# Patient Record
Sex: Female | Born: 1975 | Race: Black or African American | Hispanic: No | Smoking: Never smoker
Health system: Southern US, Community
[De-identification: ages and names within clinical notes are randomized; demographics above are authoritative.]

## PROBLEM LIST (undated history)

## (undated) DIAGNOSIS — Z22322 Carrier or suspected carrier of Methicillin resistant Staphylococcus aureus: Secondary | ICD-10-CM

## (undated) HISTORY — PX: BREAST BIOPSY: SHX20

---

## 2005-12-06 ENCOUNTER — Emergency Department (HOSPITAL_COMMUNITY): Admission: EM | Admit: 2005-12-06 | Discharge: 2005-12-07 | Payer: Self-pay | Admitting: Emergency Medicine

## 2006-07-14 ENCOUNTER — Emergency Department (HOSPITAL_COMMUNITY): Admission: EM | Admit: 2006-07-14 | Discharge: 2006-07-14 | Payer: Self-pay | Admitting: Family Medicine

## 2007-03-08 ENCOUNTER — Emergency Department (HOSPITAL_COMMUNITY): Admission: EM | Admit: 2007-03-08 | Discharge: 2007-03-08 | Payer: Self-pay | Admitting: Emergency Medicine

## 2007-03-11 ENCOUNTER — Emergency Department (HOSPITAL_COMMUNITY): Admission: EM | Admit: 2007-03-11 | Discharge: 2007-03-11 | Payer: Self-pay | Admitting: Family Medicine

## 2009-12-28 ENCOUNTER — Emergency Department (HOSPITAL_COMMUNITY): Admission: EM | Admit: 2009-12-28 | Discharge: 2009-12-28 | Payer: Self-pay | Admitting: Emergency Medicine

## 2009-12-30 ENCOUNTER — Emergency Department (HOSPITAL_COMMUNITY): Admission: EM | Admit: 2009-12-30 | Discharge: 2009-12-30 | Payer: Self-pay | Admitting: Emergency Medicine

## 2010-06-16 ENCOUNTER — Emergency Department (HOSPITAL_COMMUNITY): Admission: EM | Admit: 2010-06-16 | Discharge: 2010-06-16 | Payer: Self-pay | Admitting: Emergency Medicine

## 2010-06-16 ENCOUNTER — Ambulatory Visit: Payer: Self-pay | Admitting: Vascular Surgery

## 2011-02-24 LAB — URINALYSIS, ROUTINE W REFLEX MICROSCOPIC
Bilirubin Urine: NEGATIVE
Glucose, UA: NEGATIVE mg/dL
Hgb urine dipstick: NEGATIVE
Ketones, ur: NEGATIVE mg/dL
Nitrite: NEGATIVE
Protein, ur: NEGATIVE mg/dL
Specific Gravity, Urine: 1.008 (ref 1.005–1.030)
Urobilinogen, UA: 0.2 mg/dL (ref 0.0–1.0)
pH: 6.5 (ref 5.0–8.0)

## 2011-02-24 LAB — CBC
HCT: 31.7 % — ABNORMAL LOW (ref 36.0–46.0)
Hemoglobin: 10.7 g/dL — ABNORMAL LOW (ref 12.0–15.0)
MCH: 22.7 pg — ABNORMAL LOW (ref 26.0–34.0)
MCHC: 33.7 g/dL (ref 30.0–36.0)
MCV: 67.3 fL — ABNORMAL LOW (ref 78.0–100.0)
Platelets: 442 10*3/uL — ABNORMAL HIGH (ref 150–400)
RBC: 4.72 MIL/uL (ref 3.87–5.11)
RDW: 18.6 % — ABNORMAL HIGH (ref 11.5–15.5)
WBC: 8.1 10*3/uL (ref 4.0–10.5)

## 2011-02-24 LAB — COMPREHENSIVE METABOLIC PANEL
ALT: 13 U/L (ref 0–35)
AST: 25 U/L (ref 0–37)
Albumin: 4.1 g/dL (ref 3.5–5.2)
Alkaline Phosphatase: 79 U/L (ref 39–117)
BUN: 7 mg/dL (ref 6–23)
CO2: 26 mEq/L (ref 19–32)
Calcium: 8.9 mg/dL (ref 8.4–10.5)
Chloride: 100 mEq/L (ref 96–112)
Creatinine, Ser: 0.87 mg/dL (ref 0.4–1.2)
GFR calc Af Amer: >60 mL/min (ref >60)
GFR calc non Af Amer: >60 mL/min (ref >60)
Glucose, Bld: 86 mg/dL (ref 70–99)
Potassium: 3.8 mEq/L (ref 3.5–5.1)
Sodium: 134 mEq/L — ABNORMAL LOW (ref 135–145)
Total Bilirubin: 0.7 mg/dL (ref 0.3–1.2)
Total Protein: 8.6 g/dL — ABNORMAL HIGH (ref 6.0–8.3)

## 2011-02-24 LAB — DIFFERENTIAL
Basophils Absolute: 0.2 10*3/uL — ABNORMAL HIGH (ref 0.0–0.1)
Basophils Relative: 3 % — ABNORMAL HIGH (ref 0–1)
Eosinophils Absolute: 0.1 10*3/uL (ref 0.0–0.7)
Eosinophils Relative: 1 % (ref 0–5)
Lymphocytes Relative: 41 % (ref 12–46)
Lymphs Abs: 3.3 10*3/uL (ref 0.7–4.0)
Monocytes Absolute: 0.4 10*3/uL (ref 0.1–1.0)
Monocytes Relative: 5 % (ref 3–12)
Neutro Abs: 4.1 10*3/uL (ref 1.7–7.7)
Neutrophils Relative %: 50 % (ref 43–77)

## 2011-02-24 LAB — D-DIMER, QUANTITATIVE: D-Dimer, Quant: 0.22 ug/mL-FEU (ref 0.00–0.48)

## 2011-02-24 LAB — URINE MICROSCOPIC-ADD ON

## 2011-02-24 LAB — BRAIN NATRIURETIC PEPTIDE: Pro B Natriuretic peptide (BNP): <30 pg/mL (ref 0.0–100.0)

## 2011-02-24 LAB — POCT PREGNANCY, URINE: Preg Test, Ur: NEGATIVE

## 2011-04-03 ENCOUNTER — Emergency Department (HOSPITAL_COMMUNITY): Payer: Self-pay

## 2011-04-03 ENCOUNTER — Emergency Department (HOSPITAL_COMMUNITY)
Admission: EM | Admit: 2011-04-03 | Discharge: 2011-04-03 | Disposition: A | Discharge status: Home / Self Care | Payer: Self-pay | Attending: Emergency Medicine | Admitting: Emergency Medicine

## 2011-04-03 DIAGNOSIS — R059 Cough, unspecified: Secondary | ICD-10-CM | POA: Insufficient documentation

## 2011-04-03 DIAGNOSIS — R0602 Shortness of breath: Secondary | ICD-10-CM | POA: Insufficient documentation

## 2011-04-03 DIAGNOSIS — R05 Cough: Secondary | ICD-10-CM | POA: Insufficient documentation

## 2011-04-03 DIAGNOSIS — J40 Bronchitis, not specified as acute or chronic: Secondary | ICD-10-CM | POA: Insufficient documentation

## 2011-11-05 ENCOUNTER — Encounter: Payer: Self-pay | Admitting: Emergency Medicine

## 2011-11-05 ENCOUNTER — Emergency Department (HOSPITAL_COMMUNITY)
Admission: EM | Admit: 2011-11-05 | Discharge: 2011-11-05 | Disposition: A | Discharge status: Home / Self Care | Payer: Self-pay | Attending: Emergency Medicine | Admitting: Emergency Medicine

## 2011-11-05 DIAGNOSIS — R21 Rash and other nonspecific skin eruption: Secondary | ICD-10-CM | POA: Insufficient documentation

## 2011-11-05 DIAGNOSIS — M7989 Other specified soft tissue disorders: Secondary | ICD-10-CM | POA: Insufficient documentation

## 2011-11-05 DIAGNOSIS — L0291 Cutaneous abscess, unspecified: Secondary | ICD-10-CM | POA: Insufficient documentation

## 2011-11-05 DIAGNOSIS — L0390 Cellulitis, unspecified: Secondary | ICD-10-CM

## 2011-11-05 LAB — POCT I-STAT, CHEM 8
BUN: 6 mg/dL (ref 6–23)
Calcium, Ion: 1.15 mmol/L (ref 1.12–1.32)
Chloride: 104 mEq/L (ref 96–112)
Creatinine, Ser: 0.8 mg/dL (ref 0.50–1.10)
Glucose, Bld: 87 mg/dL (ref 70–99)
HCT: 29 % — ABNORMAL LOW (ref 36.0–46.0)
Hemoglobin: 9.9 g/dL — ABNORMAL LOW (ref 12.0–15.0)
Potassium: 4 mEq/L (ref 3.5–5.1)
Sodium: 140 mEq/L (ref 135–145)
TCO2: 23 mmol/L (ref 0–100)

## 2011-11-05 MED ORDER — OXYCODONE-ACETAMINOPHEN 5-325 MG PO TABS: 1.0000 | ORAL_TABLET | Freq: Four times a day (QID) | ORAL | Status: AC | PRN

## 2011-11-05 MED ORDER — CEPHALEXIN 500 MG PO CAPS
500.0000 mg | ORAL_CAPSULE | Freq: Once | ORAL | Status: AC
  Administered 2011-11-05: 500 mg via ORAL
  Filled 2011-11-05: qty 1

## 2011-11-05 MED ORDER — POTASSIUM CHLORIDE CRYS ER 20 MEQ PO TBCR: 20.0000 meq | EXTENDED_RELEASE_TABLET | Freq: Two times a day (BID) | ORAL | Status: DC

## 2011-11-05 MED ORDER — FUROSEMIDE 20 MG PO TABS: 40.0000 mg | ORAL_TABLET | Freq: Every day | ORAL | Status: DC

## 2011-11-05 MED ORDER — OXYCODONE-ACETAMINOPHEN 5-325 MG PO TABS
1.0000 | ORAL_TABLET | Freq: Once | ORAL | Status: AC
  Administered 2011-11-05: 1 via ORAL
  Filled 2011-11-05: qty 1

## 2011-11-05 MED ORDER — CEPHALEXIN 500 MG PO CAPS: 500.0000 mg | ORAL_CAPSULE | Freq: Four times a day (QID) | ORAL | Status: DC

## 2011-11-05 NOTE — ED Notes (Signed)
Pt states has been having swelling in legs but worse in R leg. States feels "fluid moving in back" pt was recently at beach and is unsure if she got stung or not

## 2011-11-05 NOTE — ED Notes (Signed)
Pt states is suppose to be taking fluid and potassium pills.

## 2011-11-05 NOTE — ED Provider Notes (Signed)
History     CSN: [id number] Arrival date & time: 11/05/2011 11:57 AM   First MD Initiated Contact with Patient 11/05/11 1235      Chief Complaint  Patient presents with  . Leg Swelling    (Consider location/radiation/quality/duration/timing/severity/associated sxs/prior treatment) HPI Pt reports she has had swelling in her bilateral LE off and on for months. She has had extensive workup including labs and doppler which have been neg. She was taking Lasix 40mg  for same, but has not had any in about a month. She has felt increased swelling for the last few days, improved with rest and elevation. She has also noticed an area of swelling, pain and redness to her R lateral thigh. No known injury, possibly bit by something while out of town. No drainage or fever. No Vomiting or diarrhea.   History reviewed. No pertinent past medical history.  Past Surgical History  Procedure Date  . Cesarean section     History reviewed. No pertinent family history.  History  Substance Use Topics  . Smoking status: Not on file  . Smokeless tobacco: Not on file  . Alcohol Use: No    OB History    Grav Para Term Preterm Abortions TAB SAB Ect Mult Living                  Review of Systems Unable to assess due to mental status.   Allergies  Review of patient's allergies indicates no known allergies.  Home Medications   Current Outpatient Rx  Name Route Sig Dispense Refill  . ADAPALENE 0.1 % EX CREA Topical Apply 1 application topically daily.      . IBUPROFEN 800 MG PO TABS Oral Take 800 mg by mouth every 8 (eight) hours as needed. For menstrual pain       BP 124/88  Pulse 92  Temp(Src) 98.8 F (37.1 C) (Oral)  Resp 16  SpO2 100%  LMP 10/30/2011  Physical Exam  Nursing note and vitals reviewed. Constitutional: She is oriented to person, place, and time. She appears well-developed and well-nourished.  HENT:  Head: Normocephalic and atraumatic.  Eyes: EOM are normal. Pupils are  equal, round, and reactive to light.  Neck: Normal range of motion. Neck supple.  Cardiovascular: Normal rate, normal heart sounds and intact distal pulses.   Pulmonary/Chest: Effort normal and breath sounds normal.  Abdominal: Bowel sounds are normal. She exhibits no distension. There is no tenderness.  Musculoskeletal: Normal range of motion. She exhibits no edema and no tenderness.  Neurological: She is alert and oriented to person, place, and time. She has normal strength. No cranial nerve deficit or sensory deficit.  Skin: Skin is warm and dry. Rash (4cm x 4cm area of erythema and induration to R lateral thigh without focal fluctuance) noted.  Psychiatric: She has a normal mood and affect.    ED Course  Procedures (including critical care time)  Labs Reviewed  POCT I-STAT, CHEM 8 - Abnormal; Notable for the following:    Hemoglobin 9.9 (*)    HCT 29.0 (*)    All other components within normal limits  I-STAT, CHEM 8   No results found.   No diagnosis found.    MDM  No significant LE edema. Chem is normal. Will refill Lasix and Potassium, advised PCP followup. Abx and pain meds for cellulitis of R thigh.        Charles B. Bernette Mayers, MD 11/05/11 1336

## 2011-11-05 NOTE — ED Notes (Signed)
Pt given discharge instructions and verbalizes understanding  

## 2011-11-07 ENCOUNTER — Encounter (HOSPITAL_COMMUNITY): Payer: Self-pay | Admitting: *Deleted

## 2011-11-07 ENCOUNTER — Emergency Department (HOSPITAL_COMMUNITY)
Admission: EM | Admit: 2011-11-07 | Discharge: 2011-11-07 | Disposition: A | Discharge status: Home / Self Care | Payer: Self-pay | Attending: Emergency Medicine | Admitting: Emergency Medicine

## 2011-11-07 DIAGNOSIS — L02415 Cutaneous abscess of right lower limb: Secondary | ICD-10-CM

## 2011-11-07 DIAGNOSIS — L03119 Cellulitis of unspecified part of limb: Secondary | ICD-10-CM | POA: Insufficient documentation

## 2011-11-07 DIAGNOSIS — L0390 Cellulitis, unspecified: Secondary | ICD-10-CM

## 2011-11-07 DIAGNOSIS — L02419 Cutaneous abscess of limb, unspecified: Secondary | ICD-10-CM | POA: Insufficient documentation

## 2011-11-07 DIAGNOSIS — M25559 Pain in unspecified hip: Secondary | ICD-10-CM | POA: Insufficient documentation

## 2011-11-07 DIAGNOSIS — L0291 Cutaneous abscess, unspecified: Secondary | ICD-10-CM | POA: Insufficient documentation

## 2011-11-07 MED ORDER — LIDOCAINE-EPINEPHRINE 2 %-1:100000 IJ SOLN
20.0000 mL | Freq: Once | INTRAMUSCULAR | Status: DC
  Filled 2011-11-07: qty 1

## 2011-11-07 MED ORDER — MORPHINE SULFATE 4 MG/ML IJ SOLN: 4.0000 mg | Freq: Once | INTRAMUSCULAR | Status: DC

## 2011-11-07 MED ORDER — MORPHINE SULFATE 2 MG/ML IJ SOLN
INTRAMUSCULAR | Status: AC
  Administered 2011-11-07: 4 mg via INTRAMUSCULAR
  Filled 2011-11-07: qty 2

## 2011-11-07 MED ORDER — DOXYCYCLINE HYCLATE 100 MG PO CAPS: 100.0000 mg | ORAL_CAPSULE | Freq: Two times a day (BID) | ORAL | Status: DC

## 2011-11-07 NOTE — ED Provider Notes (Signed)
Medical screening examination/treatment/procedure(s) were performed by non-physician practitioner and as supervising physician I was immediately available for consultation/collaboration.  Flint Melter, MD 11/07/11 2055

## 2011-11-07 NOTE — ED Notes (Signed)
Pt states "seen here on Tuesday, told it was an insect bite, but it has gotten worse, have been taking abx"; pt presents with edema & erythema to right hip

## 2011-11-07 NOTE — ED Notes (Signed)
Morphine 4 mg out of stock--gave 2 of the 2mg  in one injection--right hip.  Tolerated well.

## 2011-11-07 NOTE — ED Provider Notes (Signed)
History     CSN: [id number] Arrival date & time: 11/07/2011 11:47 AM   First MD Initiated Contact with Patient 11/07/11 1243     1:01 PM HPI Patient reports 2 days ago was seen here in the emergency department and given Keflex and analgesics for right upper thigh cellulitis. States area has worsened. Reports a central pustule without drainage. Reports increasing pain increasing redness. Denies fever. Reports pain medicine only helps a little bit. Patient is a 35 y.o. female presenting with abscess. The history is provided by the patient.  Abscess  This is a new problem. The current episode started less than one week ago. The onset was gradual. The problem occurs continuously. The problem has been gradually worsening. The abscess is present on the right upper leg. The problem is severe. The abscess is characterized by redness. It is unknown what she was exposed to. Associated symptoms include decreased physical activity. Pertinent negatives include no fever, no rhinorrhea and no cough. There were no sick contacts. Recently, medical care has been given at this facility. Services received include medications given.    History reviewed. No pertinent past medical history.  Past Surgical History  Procedure Date  . Cesarean section     No family history on file.  History  Substance Use Topics  . Smoking status: Not on file  . Smokeless tobacco: Not on file  . Alcohol Use: No    OB History    Grav Para Term Preterm Abortions TAB SAB Ect Mult Living                  Review of Systems  Constitutional: Negative for fever and chills.  HENT: Negative for rhinorrhea.   Eyes: Negative for redness.  Respiratory: Negative for cough, shortness of breath and wheezing.   Cardiovascular: Negative for chest pain and palpitations.  Gastrointestinal: Negative for nausea.  Skin: Positive for color change.       Abscess    Allergies  Review of patient's allergies indicates no known  allergies.  Home Medications   Current Outpatient Rx  Name Route Sig Dispense Refill  . ADAPALENE 0.1 % EX CREA Topical Apply 1 application topically daily.      . CEPHALEXIN 500 MG PO CAPS Oral Take 1 capsule (500 mg total) by mouth 4 (four) times daily. 20 capsule 0  . FUROSEMIDE 20 MG PO TABS Oral Take 2 tablets (40 mg total) by mouth daily. 60 tablet 0  . IBUPROFEN 800 MG PO TABS Oral Take 800 mg by mouth every 8 (eight) hours as needed. For menstrual pain     . OXYCODONE-ACETAMINOPHEN 5-325 MG PO TABS Oral Take 1-2 tablets by mouth every 6 (six) hours as needed for pain. 20 tablet 0  . POTASSIUM CHLORIDE CRYS CR 20 MEQ PO TBCR Oral Take 1 tablet (20 mEq total) by mouth 2 (two) times daily. 30 tablet 0    BP 123/89  Pulse 111  Temp(Src) 99.1 F (37.3 C) (Oral)  Resp 20  Wt 150 lb (68.04 kg)  SpO2 100%  LMP 10/30/2011  Physical Exam  Vitals reviewed. Constitutional: She is oriented to person, place, and time. Vital signs are normal. She appears well-developed and well-nourished. No distress.  HENT:  Head: Normocephalic and atraumatic.  Eyes: Pupils are equal, round, and reactive to light.  Neck: Neck supple.  Pulmonary/Chest: Effort normal.  Neurological: She is alert and oriented to person, place, and time.  Skin: Skin is warm and dry. No rash  noted. No pallor.       Right superior lateral  thigh with 6 cm diameter cellulitis and a central 2 cm slightly fluctuant abscess with minimal drainage  Psychiatric: She has a normal mood and affect. Her behavior is normal.    ED Course  INCISION AND DRAINAGE Performed by: Thomasene Lot Authorized by: Thomasene Lot Consent: Verbal consent obtained. Risks and benefits: risks, benefits and alternatives were discussed Consent given by: patient Patient understanding: patient states understanding of the procedure being performed Patient consent: the patient's understanding of the procedure matches consent given Site marked:  the operative site was marked Imaging studies: imaging studies not available Patient identity confirmed: verbally with patient Time out: Immediately prior to procedure a "time out" was called to verify the correct patient, procedure, equipment, support staff and site/side marked as required. Type: abscess Body area: lower extremity Location details: right leg Anesthesia: local infiltration Local anesthetic: lidocaine 2% with epinephrine Anesthetic total: 5 ml Patient sedated: no Scalpel size: 11 Incision type: single straight Complexity: simple Drainage: purulent Drainage amount: moderate Wound treatment: wound left open Packing material: 1/4 in iodoform gauze Patient tolerance: Patient tolerated the procedure well with no immediate complications. Comments: Wound located on the right superior lateral thigh.       MDM         Thomasene Lot, PA 11/07/11 [phone]

## 2011-11-07 NOTE — ED Notes (Signed)
Large approx. 6" in diameter area along right hip lateral aspect with a center that is peeling--she states she came in here for same on Tuesday and was given antibiotics which she has been taking--this a.m. While in the shower center of lesion began to drain--no draining at this time.  Rates her pain a 10 on 1-10 scale.

## 2011-11-10 LAB — WOUND CULTURE

## 2011-11-11 ENCOUNTER — Telehealth (HOSPITAL_COMMUNITY): Payer: Self-pay | Admitting: Emergency Medicine

## 2011-11-11 ENCOUNTER — Emergency Department (HOSPITAL_COMMUNITY)
Admission: EM | Admit: 2011-11-11 | Discharge: 2011-11-11 | Discharge status: Against Medical Advice | Payer: Self-pay | Attending: Emergency Medicine | Admitting: Emergency Medicine

## 2011-11-11 DIAGNOSIS — R238 Other skin changes: Secondary | ICD-10-CM | POA: Insufficient documentation

## 2011-11-11 NOTE — ED Notes (Signed)
+   MRSA Patient informed of positive results and educated to MRSA precautions.

## 2011-11-12 ENCOUNTER — Emergency Department (HOSPITAL_COMMUNITY)
Admission: EM | Admit: 2011-11-12 | Discharge: 2011-11-12 | Disposition: A | Discharge status: Home / Self Care | Payer: Self-pay | Attending: Emergency Medicine | Admitting: Emergency Medicine

## 2011-11-12 ENCOUNTER — Encounter (HOSPITAL_COMMUNITY): Payer: Self-pay | Admitting: Emergency Medicine

## 2011-11-12 DIAGNOSIS — Z8614 Personal history of Methicillin resistant Staphylococcus aureus infection: Secondary | ICD-10-CM | POA: Insufficient documentation

## 2011-11-12 DIAGNOSIS — Z09 Encounter for follow-up examination after completed treatment for conditions other than malignant neoplasm: Secondary | ICD-10-CM | POA: Insufficient documentation

## 2011-11-12 DIAGNOSIS — L02419 Cutaneous abscess of limb, unspecified: Secondary | ICD-10-CM | POA: Insufficient documentation

## 2011-11-12 DIAGNOSIS — IMO0002 Reserved for concepts with insufficient information to code with codable children: Secondary | ICD-10-CM

## 2011-11-12 DIAGNOSIS — Z22322 Carrier or suspected carrier of Methicillin resistant Staphylococcus aureus: Secondary | ICD-10-CM | POA: Insufficient documentation

## 2011-11-12 DIAGNOSIS — L03119 Cellulitis of unspecified part of limb: Secondary | ICD-10-CM | POA: Insufficient documentation

## 2011-11-12 HISTORY — DX: Carrier or suspected carrier of methicillin resistant Staphylococcus aureus: Z22.322

## 2011-11-12 MED ORDER — ONDANSETRON HCL 4 MG PO TABS: 8.0000 mg | ORAL_TABLET | Freq: Three times a day (TID) | ORAL | Status: AC | PRN

## 2011-11-12 MED ORDER — ONDANSETRON 8 MG PO TBDP
8.0000 mg | ORAL_TABLET | Freq: Once | ORAL | Status: AC
  Administered 2011-11-12: 8 mg via ORAL
  Filled 2011-11-12: qty 1

## 2011-11-12 NOTE — ED Provider Notes (Signed)
Medical screening examination/treatment/procedure(s) were performed by non-physician practitioner and as supervising physician I was immediately available for consultation/collaboration.  Flint Melter, MD 11/12/11 2044

## 2011-11-12 NOTE — ED Notes (Signed)
Pt reports nausea, discharge delayed. Snack provided post zofran

## 2011-11-12 NOTE — ED Provider Notes (Signed)
History     CSN: [id number] Arrival date & time: 11/12/2011  9:24 AM   First MD Initiated Contact with Patient 11/12/11 [phone]      Chief Complaint  Patient presents with  . Wound Check    r/hip, packing removed at home   Patient is a 35 y.o. female presenting with wound check.  Wound Check  She was treated in the ED 3 to 5 days ago. Previous treatment in the ED includes I&D of abscess. Treatments since wound repair include oral antibiotics and regular soap and water washings. There has been no drainage from the wound. There is no redness present. There is no swelling present. The pain has no pain. She has no difficulty moving the affected extremity or digit.   Patient presents to the emergency room with complaint of wound recheck. Patient had an abscess on her right upper thigh I&D, has taken antibiotics keflex and doxy as directed. Patient denies any complaints. States that the swelling and pain are gone. Reports improvement in symptoms.   Past Medical History  Diagnosis Date  . MRSA (methicillin resistant staph aureus) culture positive     Past Surgical History  Procedure Date  . Cesarean section     Family History  Problem Relation Age of Onset  . Diabetes Mother   . Cancer Mother   . Hypertension Mother     History  Substance Use Topics  . Smoking status: Not on file  . Smokeless tobacco: Never Used  . Alcohol Use: No    OB History    Grav Para Term Preterm Abortions TAB SAB Ect Mult Living                  Review of Systems  Constitutional: Negative for fever, chills, diaphoresis and appetite change.  HENT: Negative for neck pain.   Eyes: Negative for photophobia and visual disturbance.  Respiratory: Negative for cough, chest tightness and shortness of breath.   Cardiovascular: Negative for chest pain.  Gastrointestinal: Negative for nausea, vomiting and abdominal pain.  Genitourinary: Negative for flank pain.  Musculoskeletal: Negative for back pain.    Skin: Negative for rash.  Neurological: Negative for weakness and numbness.  All other systems reviewed and are negative.    Allergies  Review of patient's allergies indicates no known allergies.  Home Medications   Current Outpatient Rx  Name Route Sig Dispense Refill  . ADAPALENE 0.1 % EX CREA Topical Apply 1 application topically daily.      . CEPHALEXIN 500 MG PO CAPS Oral Take 500 mg by mouth 4 (four) times daily. Finished on Sunday 11-10-11     . DOXYCYCLINE HYCLATE 100 MG PO CAPS Oral Take 100 mg by mouth 2 (two) times daily. Started on 11-11-11 for 10 days on day 2 of therapy     . FUROSEMIDE 20 MG PO TABS Oral Take 40 mg by mouth daily. Fluid     . IBUPROFEN 800 MG PO TABS Oral Take 800 mg by mouth every 8 (eight) hours as needed. For menstrual pain     . OXYCODONE-ACETAMINOPHEN 5-325 MG PO TABS Oral Take 1-2 tablets by mouth every 6 (six) hours as needed for pain. 20 tablet 0  . POTASSIUM CHLORIDE CRYS CR 20 MEQ PO TBCR Oral Take 1 tablet (20 mEq total) by mouth 2 (two) times daily. 30 tablet 0    BP 123/73  Pulse 96  Temp(Src) 98.8 F (37.1 C) (Oral)  Resp 16  SpO2 98%  LMP 10/30/2011  Physical Exam  Nursing note and vitals reviewed. Constitutional: She appears well-developed and well-nourished.  Musculoskeletal: She exhibits no tenderness.       Well healed I&D to the right thigh. No erythema, warmth or swelling. No induration. No purulent discharge. No fluctuant mass.   Skin: Skin is warm and dry. No rash noted. No erythema. No pallor.  Psychiatric: She has a normal mood and affect. Her behavior is normal. Judgment and thought content normal.    ED Course  Procedures (including critical care time)  Patient seen and evaluated.  VSS reviewed. . Nursing notes reviewed. Discussed with attending physician. Initial testing ordered. Will monitor the patient closely. They agree with the treatment plan and diagnosis.   10:37 AM wound recheck. Patient states that the  antibiotics make her nauseated at times, given her zofran in ER.    MDM  Wound recheck, no abscess, no further drainage needed.     Demetrius Charity, PA 11/12/11 1039

## 2012-05-30 ENCOUNTER — Emergency Department (HOSPITAL_COMMUNITY)
Admission: EM | Admit: 2012-05-30 | Discharge: 2012-05-30 | Disposition: A | Discharge status: Home / Self Care | Payer: Self-pay | Source: Home / Self Care | Attending: Family Medicine | Admitting: Family Medicine

## 2012-05-30 ENCOUNTER — Encounter (HOSPITAL_COMMUNITY): Payer: Self-pay

## 2012-05-30 DIAGNOSIS — R609 Edema, unspecified: Secondary | ICD-10-CM

## 2012-05-30 LAB — POCT I-STAT, CHEM 8
BUN: 12 mg/dL (ref 6–23)
Calcium, Ion: 1.2 mmol/L (ref 1.12–1.32)
Chloride: 107 mEq/L (ref 96–112)
Creatinine, Ser: 0.9 mg/dL (ref 0.50–1.10)
Glucose, Bld: 97 mg/dL (ref 70–99)
HCT: 33 % — ABNORMAL LOW (ref 36.0–46.0)
Hemoglobin: 11.2 g/dL — ABNORMAL LOW (ref 12.0–15.0)
Potassium: 5 mEq/L (ref 3.5–5.1)
Sodium: 141 mEq/L (ref 135–145)
TCO2: 24 mmol/L (ref 0–100)

## 2012-05-30 MED ORDER — FUROSEMIDE 40 MG PO TABS: 40.0000 mg | ORAL_TABLET | Freq: Every day | ORAL | Status: DC

## 2012-05-30 MED ORDER — POTASSIUM CHLORIDE ER 10 MEQ PO CPCR: 10.0000 meq | ORAL_CAPSULE | Freq: Every day | ORAL | Status: DC

## 2012-05-30 NOTE — ED Provider Notes (Signed)
History     CSN: [id number]  Arrival date & time 05/30/12  9562   First MD Initiated Contact with Patient 05/30/12 1826      Chief Complaint  Patient presents with  . Joint Pain    joint pain and fluid retention    (Consider location/radiation/quality/duration/timing/severity/associated sxs/prior treatment) Patient is a 36 y.o. female presenting with leg pain. The history is provided by the patient.  Leg Pain  The incident occurred 2 days ago (chronic recurrent edema, has not had lasix for 2 weeks., w/u has not found etiol for swelling, worse around menses.). The incident occurred at home.    Past Medical History  Diagnosis Date  . MRSA (methicillin resistant staph aureus) culture positive     Past Surgical History  Procedure Date  . Cesarean section     Family History  Problem Relation Age of Onset  . Diabetes Mother   . Cancer Mother   . Hypertension Mother     History  Substance Use Topics  . Smoking status: Not on file  . Smokeless tobacco: Never Used  . Alcohol Use: No    OB History    Grav Para Term Preterm Abortions TAB SAB Ect Mult Living                  Review of Systems  Constitutional: Negative.   HENT: Negative.   Eyes: Negative.   Respiratory: Negative.   Gastrointestinal: Negative.   Musculoskeletal: Positive for joint swelling.  Skin: Negative.     Allergies  Review of patient's allergies indicates no known allergies.  Home Medications   Current Outpatient Rx  Name Route Sig Dispense Refill  . GARLIC PO Oral Take by mouth.    Marland Kitchen OVER THE COUNTER MEDICATION      . PROBIOTIC COMPLEX ACIDOPHILUS PO Oral Take by mouth.    Marland Kitchen RASPBERRY PO Oral Take by mouth.    . ADAPALENE 0.1 % EX CREA Topical Apply 1 application topically daily.      . FUROSEMIDE 20 MG PO TABS Oral Take 40 mg by mouth daily. Fluid     . FUROSEMIDE 40 MG PO TABS Oral Take 1 tablet (40 mg total) by mouth daily. As needed for swelling. 30 tablet 0  . IBUPROFEN 800 MG  PO TABS Oral Take 800 mg by mouth every 8 (eight) hours as needed. For menstrual pain     . POTASSIUM CHLORIDE ER 10 MEQ PO CPCR Oral Take 1 capsule (10 mEq total) by mouth daily. When you take the fluid pill. 30 capsule 0  . POTASSIUM CHLORIDE CRYS ER 20 MEQ PO TBCR Oral Take 1 tablet (20 mEq total) by mouth 2 (two) times daily. 30 tablet 0    BP 120/85  Pulse 83  Temp 98.3 F (36.8 C) (Oral)  Resp 18  SpO2 100%  LMP 05/22/2012  Physical Exam  Nursing note and vitals reviewed. Constitutional: She is oriented to person, place, and time. She appears well-developed and well-nourished.  HENT:  Head: Normocephalic.  Eyes: Pupils are equal, round, and reactive to light.  Neck: Normal range of motion. No thyromegaly present.  Cardiovascular: Normal rate, regular rhythm, normal heart sounds and intact distal pulses.   Pulmonary/Chest: Effort normal and breath sounds normal.  Abdominal: Soft. Bowel sounds are normal.  Musculoskeletal: She exhibits no edema.  Lymphadenopathy:    She has no cervical adenopathy.  Neurological: She is alert and oriented to person, place, and time.  Skin: Skin is  warm and dry. No rash noted.    ED Course  Procedures (including critical care time)  Labs Reviewed  POCT I-STAT, CHEM 8 - Abnormal; Notable for the following:    Hemoglobin 11.2 (*)     HCT 33.0 (*)     All other components within normal limits   No results found.   1. Idiopathic edema       MDM  i-stat wnl, hgb 11.2      Linna Hoff, MD 05/30/12 [phone]

## 2012-05-30 NOTE — Discharge Instructions (Signed)
Try to reduce or avoid sodium as possible, use medicine as needed, see your doctor if further problems.

## 2012-05-30 NOTE — ED Notes (Signed)
Pt c/o of chest, lower legs and bilateral knee pain with fluid retention states became worse on thurs. States has hx of fluid retention and has not taken her fluid medication for 2 weeks

## 2015-03-01 ENCOUNTER — Other Ambulatory Visit (HOSPITAL_COMMUNITY)
Admission: RE | Admit: 2015-03-01 | Discharge: 2015-03-01 | Disposition: A | Discharge status: Home / Self Care | Payer: 59 | Source: Ambulatory Visit | Attending: Family Medicine | Admitting: Family Medicine

## 2015-03-01 DIAGNOSIS — Z113 Encounter for screening for infections with a predominantly sexual mode of transmission: Secondary | ICD-10-CM | POA: Diagnosis present

## 2015-03-01 DIAGNOSIS — Z124 Encounter for screening for malignant neoplasm of cervix: Secondary | ICD-10-CM | POA: Diagnosis not present

## 2015-07-10 ENCOUNTER — Encounter (HOSPITAL_COMMUNITY): Payer: Self-pay

## 2015-07-10 ENCOUNTER — Emergency Department (HOSPITAL_COMMUNITY): Payer: 59

## 2015-07-10 ENCOUNTER — Emergency Department (HOSPITAL_COMMUNITY)
Admission: EM | Admit: 2015-07-10 | Discharge: 2015-07-11 | Disposition: A | Discharge status: Home / Self Care | Payer: 59 | Attending: Emergency Medicine | Admitting: Emergency Medicine

## 2015-07-10 DIAGNOSIS — J029 Acute pharyngitis, unspecified: Secondary | ICD-10-CM | POA: Diagnosis not present

## 2015-07-10 DIAGNOSIS — R05 Cough: Secondary | ICD-10-CM

## 2015-07-10 DIAGNOSIS — J040 Acute laryngitis: Secondary | ICD-10-CM | POA: Diagnosis not present

## 2015-07-10 DIAGNOSIS — R059 Cough, unspecified: Secondary | ICD-10-CM

## 2015-07-10 MED ORDER — KETOROLAC TROMETHAMINE 30 MG/ML IJ SOLN
30.0000 mg | Freq: Once | INTRAMUSCULAR | Status: AC
  Administered 2015-07-10: 30 mg via INTRAVENOUS
  Filled 2015-07-10: qty 1

## 2015-07-10 MED ORDER — SODIUM CHLORIDE 0.9 % IV BOLUS (SEPSIS)
1000.0000 mL | Freq: Once | INTRAVENOUS | Status: AC
  Administered 2015-07-10: 1000 mL via INTRAVENOUS

## 2015-07-10 MED ORDER — LIDOCAINE VISCOUS 2 % MT SOLN
15.0000 mL | Freq: Once | OROMUCOSAL | Status: AC
  Administered 2015-07-10: 15 mL via OROMUCOSAL
  Filled 2015-07-10: qty 15

## 2015-07-10 MED ORDER — DEXAMETHASONE SODIUM PHOSPHATE 10 MG/ML IJ SOLN
10.0000 mg | Freq: Once | INTRAMUSCULAR | Status: AC
  Administered 2015-07-10: 10 mg via INTRAVENOUS
  Filled 2015-07-10: qty 1

## 2015-07-10 NOTE — ED Provider Notes (Signed)
CSN: [id number]     Arrival date & time 07/10/15  2121 History   First MD Initiated Contact with Patient 07/10/15 2222     Chief Complaint  Patient presents with  . Cough     (Consider location/radiation/quality/duration/timing/severity/associated sxs/prior Treatment) Patient is a 39 y.o. female presenting with pharyngitis.  Sore Throat This is a new problem. The problem occurs constantly. The problem has been gradually worsening. Associated symptoms include shortness of breath. Pertinent negatives include no chest pain, no abdominal pain and no headaches. Treatments tried: throat lozenges. The treatment provided no relief.    History reviewed. No pertinent past medical history. History reviewed. No pertinent past surgical history. History reviewed. No pertinent family history. History  Substance Use Topics  . Smoking status: Never Smoker   . Smokeless tobacco: Not on file  . Alcohol Use: No   OB History    No data available     Review of Systems  Constitutional: Negative for fever.  HENT: Positive for sore throat, trouble swallowing (pain) and voice change. Negative for congestion and drooling.   Eyes: Negative for visual disturbance.  Respiratory: Positive for cough, shortness of breath and wheezing.   Cardiovascular: Negative for chest pain.  Gastrointestinal: Positive for vomiting. Negative for nausea and abdominal pain.  Genitourinary: Negative for difficulty urinating.  Musculoskeletal: Negative for back pain and neck pain.  Skin: Negative for rash.  Neurological: Negative for syncope and headaches.      Allergies  Review of patient's allergies indicates no known allergies.  Home Medications   Prior to Admission medications   Medication Sig Start Date End Date Taking? Authorizing Provider  furosemide (LASIX) 40 MG tablet Take 40 mg by mouth daily as needed for fluid.   Yes Historical Provider, MD  potassium chloride SA (K-DUR,KLOR-CON) 20 MEQ tablet Take 20 mEq  by mouth daily as needed. Taken when she has extra fluid only   Yes Historical Provider, MD   BP 131/74 mmHg  Pulse 110  Temp(Src) 99 F (37.2 C) (Oral)  Resp 20  SpO2 100%  LMP 06/18/2015 Physical Exam  Constitutional: She is oriented to person, place, and time. She appears well-developed and well-nourished. No distress.  HENT:  Head: Normocephalic and atraumatic.  Mouth/Throat: Oropharynx is clear and moist. No oropharyngeal exudate.  Eyes: Conjunctivae and EOM are normal. Pupils are equal, round, and reactive to light.  Neck: Normal range of motion.  Cardiovascular: Normal rate, regular rhythm, normal heart sounds and intact distal pulses.  Exam reveals no gallop and no friction rub.   No murmur heard. Pulmonary/Chest: Effort normal and breath sounds normal. No stridor. No respiratory distress. She has no wheezes. She has no rales.  Abdominal: Soft. She exhibits no distension. There is no tenderness. There is no guarding.  Musculoskeletal: She exhibits no edema or tenderness.  Neurological: She is alert and oriented to person, place, and time.  Skin: Skin is warm and dry. No rash noted. She is not diaphoretic. No erythema.  Nursing note and vitals reviewed.   ED Course  Procedures (including critical care time) Labs Review Labs Reviewed - No data to display  Imaging Review No results found.   EKG Interpretation None      MDM   Final diagnoses:  None   39 year old female in no severe medical history presents with concern of sore throat and cough. Have low suspicion for epiglottitis, retropharyngeal abscess, peritonsillar abscess, strep throat based off of history, exam, and low Centor criteria. No stridor,  no drooling, afebrile.  Chest x-ray was done to evaluate for pneumonia and showed no acute findings.  She reported fatigue and decreased by mouth intake and had mild tachycardia and and was given one bolus of normal saline. CMP showed normal electrolytes and a CBC  showed a normal white count.  Pt was given decadron, lidocaine and toradol for symptom relief.  She was discharged with a prescription for [facility] for cough, and naproxen.  Pt with likely viral pharyngitis/laryngitis and cough. Patient discharged in stable condition with understanding of reasons to return.    Gareth Morgan, MD 07/11/15 [phone]

## 2015-07-10 NOTE — ED Notes (Signed)
Pt complains of a productive cough since Thursday, she's has laryngitis and a slight wheeze

## 2015-07-11 LAB — CBC WITH DIFFERENTIAL/PLATELET
Basophils Absolute: 0 10*3/uL (ref 0.0–0.1)
Basophils Relative: 0 % (ref 0–1)
Eosinophils Absolute: 0.2 10*3/uL (ref 0.0–0.7)
Eosinophils Relative: 2 % (ref 0–5)
HCT: 29.9 % — ABNORMAL LOW (ref 36.0–46.0)
Hemoglobin: 9.6 g/dL — ABNORMAL LOW (ref 12.0–15.0)
Lymphocytes Relative: 34 % (ref 12–46)
Lymphs Abs: 3.4 10*3/uL (ref 0.7–4.0)
MCH: 21 pg — ABNORMAL LOW (ref 26.0–34.0)
MCHC: 32.1 g/dL (ref 30.0–36.0)
MCV: 65.4 fL — ABNORMAL LOW (ref 78.0–100.0)
Monocytes Absolute: 0.9 10*3/uL (ref 0.1–1.0)
Monocytes Relative: 9 % (ref 3–12)
Neutro Abs: 5.6 10*3/uL (ref 1.7–7.7)
Neutrophils Relative %: 55 % (ref 43–77)
Platelets: 395 10*3/uL (ref 150–400)
RBC: 4.57 MIL/uL (ref 3.87–5.11)
RDW: 19.5 % — ABNORMAL HIGH (ref 11.5–15.5)
WBC: 10.1 10*3/uL (ref 4.0–10.5)

## 2015-07-11 LAB — COMPREHENSIVE METABOLIC PANEL
ALT: 12 U/L — ABNORMAL LOW (ref 14–54)
AST: 18 U/L (ref 15–41)
Albumin: 3.7 g/dL (ref 3.5–5.0)
Alkaline Phosphatase: 83 U/L (ref 38–126)
Anion gap: 8 (ref 5–15)
BUN: 9 mg/dL (ref 6–20)
CO2: 23 mmol/L (ref 22–32)
Calcium: 9.1 mg/dL (ref 8.9–10.3)
Chloride: 108 mmol/L (ref 101–111)
Creatinine, Ser: 0.98 mg/dL (ref 0.44–1.00)
GFR calc Af Amer: >60 mL/min (ref >60)
GFR calc non Af Amer: >60 mL/min (ref >60)
Glucose, Bld: 98 mg/dL (ref 65–99)
Potassium: 3.8 mmol/L (ref 3.5–5.1)
Sodium: 139 mmol/L (ref 135–145)
Total Bilirubin: 0.3 mg/dL (ref 0.3–1.2)
Total Protein: 8.2 g/dL — ABNORMAL HIGH (ref 6.5–8.1)

## 2015-07-11 MED ORDER — NAPROXEN 500 MG PO TABS: 500.0000 mg | ORAL_TABLET | Freq: Two times a day (BID) | ORAL | Status: DC

## 2015-07-11 MED ORDER — BENZONATATE 100 MG PO CAPS: 100.0000 mg | ORAL_CAPSULE | Freq: Three times a day (TID) | ORAL | Status: DC | PRN

## 2015-07-11 NOTE — Discharge Instructions (Signed)
Cough, Adult  A cough is a reflex that helps clear your throat and airways. It can help heal the body or may be a reaction to an irritated airway. A cough may only last 2 or 3 weeks (acute) or may last more than 8 weeks (chronic).  CAUSES Acute cough:  Viral or bacterial infections. Chronic cough:  Infections.  Allergies.  Asthma.  Post-nasal drip.  Smoking.  Heartburn or acid reflux.  Some medicines.  Chronic lung problems (COPD).  Cancer. SYMPTOMS   Cough.  Fever.  Chest pain.  Increased breathing rate.  High-pitched whistling sound when breathing (wheezing).  Colored mucus that you cough up (sputum). TREATMENT   A bacterial cough may be treated with antibiotic medicine.  A viral cough must run its course and will not respond to antibiotics.  Your caregiver may recommend other treatments if you have a chronic cough. HOME CARE INSTRUCTIONS   Only take over-the-counter or prescription medicines for pain, discomfort, or fever as directed by your caregiver. Use cough suppressants only as directed by your caregiver.  Use a cold steam vaporizer or humidifier in your bedroom or home to help loosen secretions.  Sleep in a semi-upright position if your cough is worse at night.  Rest as needed.  Stop smoking if you smoke. SEEK IMMEDIATE MEDICAL CARE IF:   You have pus in your sputum.  Your cough starts to worsen.  You cannot control your cough with suppressants and are losing sleep.  You begin coughing up blood.  You have difficulty breathing.  You develop pain which is getting worse or is uncontrolled with medicine.  You have a fever. MAKE SURE YOU:   Understand these instructions.  Will watch your condition.  Will get help right away if you are not doing well or get worse. Document Released: 05/24/2011 Document Revised: 02/17/2012 Document Reviewed: 05/24/2011 [employer] Patient Information 2015 [employer]. This information is not intended  to replace advice given to you by your health care provider. Make sure you discuss any questions you have with your health care provider.  

## 2016-08-14 ENCOUNTER — Other Ambulatory Visit: Payer: Self-pay | Admitting: Obstetrics and Gynecology

## 2016-08-14 ENCOUNTER — Other Ambulatory Visit (HOSPITAL_COMMUNITY)
Admission: RE | Admit: 2016-08-14 | Discharge: 2016-08-14 | Disposition: A | Discharge status: Home / Self Care | Payer: 59 | Source: Ambulatory Visit | Attending: Obstetrics and Gynecology | Admitting: Obstetrics and Gynecology

## 2016-08-14 DIAGNOSIS — Z1151 Encounter for screening for human papillomavirus (HPV): Secondary | ICD-10-CM | POA: Diagnosis not present

## 2016-08-14 DIAGNOSIS — Z01419 Encounter for gynecological examination (general) (routine) without abnormal findings: Secondary | ICD-10-CM | POA: Insufficient documentation

## 2016-08-16 LAB — CYTOLOGY - PAP

## 2016-10-22 ENCOUNTER — Other Ambulatory Visit: Payer: Self-pay | Admitting: Obstetrics and Gynecology

## 2016-12-24 DIAGNOSIS — N920 Excessive and frequent menstruation with regular cycle: Secondary | ICD-10-CM | POA: Diagnosis not present

## 2017-01-08 DIAGNOSIS — N920 Excessive and frequent menstruation with regular cycle: Secondary | ICD-10-CM | POA: Diagnosis not present

## 2017-07-01 DIAGNOSIS — Z0000 Encounter for general adult medical examination without abnormal findings: Secondary | ICD-10-CM | POA: Diagnosis not present

## 2017-07-01 DIAGNOSIS — D649 Anemia, unspecified: Secondary | ICD-10-CM | POA: Diagnosis not present

## 2017-07-01 DIAGNOSIS — N6459 Other signs and symptoms in breast: Secondary | ICD-10-CM | POA: Diagnosis not present

## 2017-07-03 ENCOUNTER — Other Ambulatory Visit: Payer: Self-pay | Admitting: Family Medicine

## 2017-07-03 DIAGNOSIS — N63 Unspecified lump in unspecified breast: Secondary | ICD-10-CM

## 2017-07-10 ENCOUNTER — Other Ambulatory Visit: Payer: Self-pay | Admitting: Family Medicine

## 2017-07-10 ENCOUNTER — Ambulatory Visit
Admission: RE | Admit: 2017-07-10 | Discharge: 2017-07-10 | Disposition: A | Discharge status: Home / Self Care | Payer: 59 | Source: Ambulatory Visit | Attending: Family Medicine | Admitting: Family Medicine

## 2017-07-10 DIAGNOSIS — N6322 Unspecified lump in the left breast, upper inner quadrant: Secondary | ICD-10-CM | POA: Diagnosis not present

## 2017-07-10 DIAGNOSIS — N63 Unspecified lump in unspecified breast: Secondary | ICD-10-CM

## 2017-07-10 DIAGNOSIS — N6321 Unspecified lump in the left breast, upper outer quadrant: Secondary | ICD-10-CM | POA: Diagnosis not present

## 2017-07-10 DIAGNOSIS — R922 Inconclusive mammogram: Secondary | ICD-10-CM | POA: Diagnosis not present

## 2017-07-10 DIAGNOSIS — N6311 Unspecified lump in the right breast, upper outer quadrant: Secondary | ICD-10-CM | POA: Diagnosis not present

## 2017-07-10 DIAGNOSIS — N6312 Unspecified lump in the right breast, upper inner quadrant: Secondary | ICD-10-CM | POA: Diagnosis not present

## 2017-07-15 ENCOUNTER — Ambulatory Visit
Admission: RE | Admit: 2017-07-15 | Discharge: 2017-07-15 | Disposition: A | Discharge status: Home / Self Care | Payer: 59 | Source: Ambulatory Visit | Attending: Family Medicine | Admitting: Family Medicine

## 2017-07-15 ENCOUNTER — Other Ambulatory Visit: Payer: Self-pay | Admitting: Family Medicine

## 2017-07-15 DIAGNOSIS — N63 Unspecified lump in unspecified breast: Secondary | ICD-10-CM

## 2017-07-15 DIAGNOSIS — D242 Benign neoplasm of left breast: Secondary | ICD-10-CM | POA: Diagnosis not present

## 2017-07-15 DIAGNOSIS — N6322 Unspecified lump in the left breast, upper inner quadrant: Secondary | ICD-10-CM | POA: Diagnosis not present

## 2017-07-15 DIAGNOSIS — N6321 Unspecified lump in the left breast, upper outer quadrant: Secondary | ICD-10-CM | POA: Diagnosis not present

## 2018-02-05 IMAGING — US US BREAST BX W LOC DEV 1ST LESION IMG BX SPEC US GUIDE*L*
1 series · 11 of 11 positions shown · non-contrast
Comparison: Previous exam(s).

ADDENDUM:
Pathology revealed a fibroadenoma in the LEFT breast. This was found
to be concordant by Dr. Bony Hollenbeck. Pathology results were
discussed with the patient by telephone. The patient reported doing
well after the biopsy with tenderness at the site. Post biopsy
instructions and care were reviewed and questions were answered. The
patient was encouraged to call [REDACTED] for any additional concerns. The patient was instructed to
return for annual screening mammography and informed that a reminder
letter would be sent regarding this appointment.

Pathology results reported by Sbhamu Gudla RN, BSN on 07/16/2017.
CLINICAL DATA: 41-year-old female with a palpable left breast mass.
EXAM:
ULTRASOUND GUIDED LEFT BREAST CORE NEEDLE BIOPSY

[Series 1: us breast bx w loc dev 1st lesion img bx spec us g · 0.06mm/px · 11 of 11 slices shown]
[im 1/11]
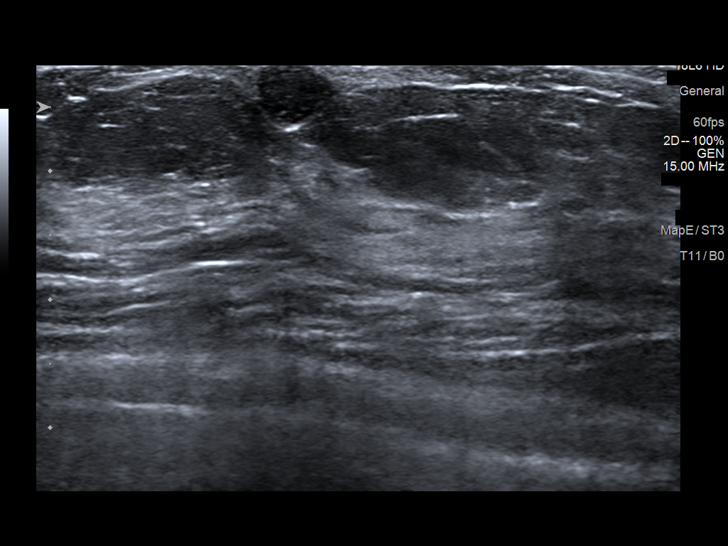
[im 2/11]
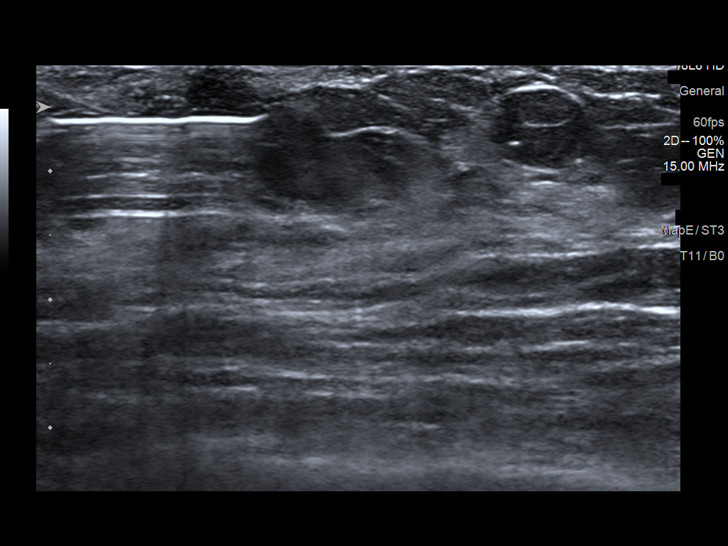
[im 3/11]
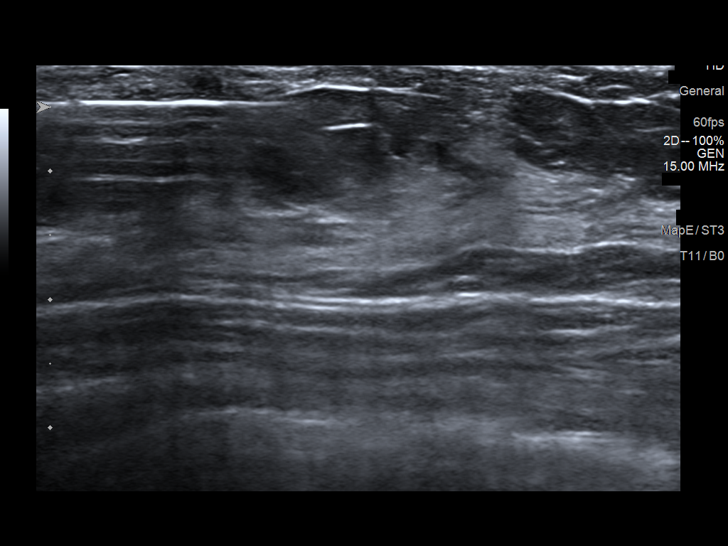
[im 4/11]
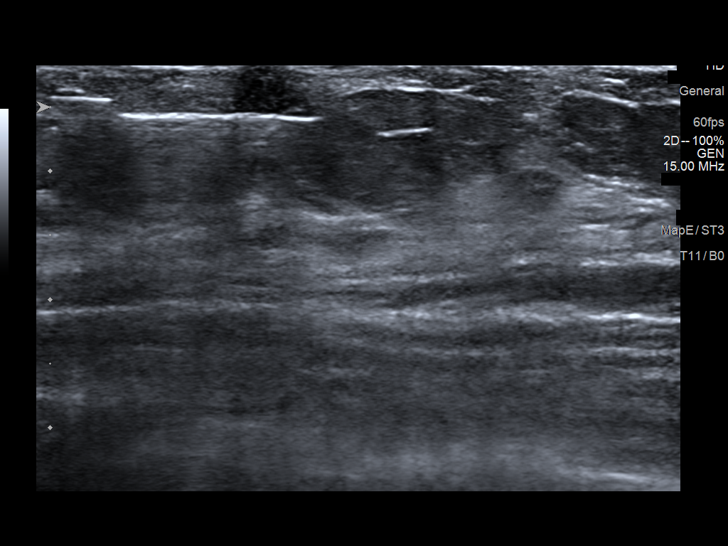
[im 5/11]
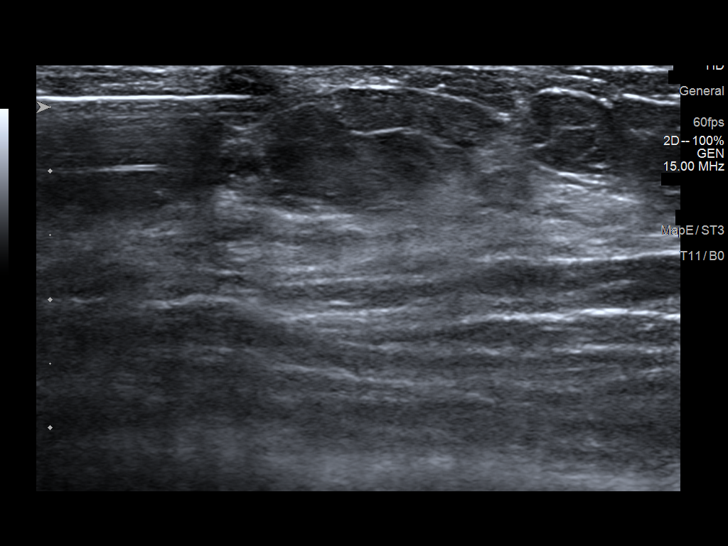
[im 6/11]
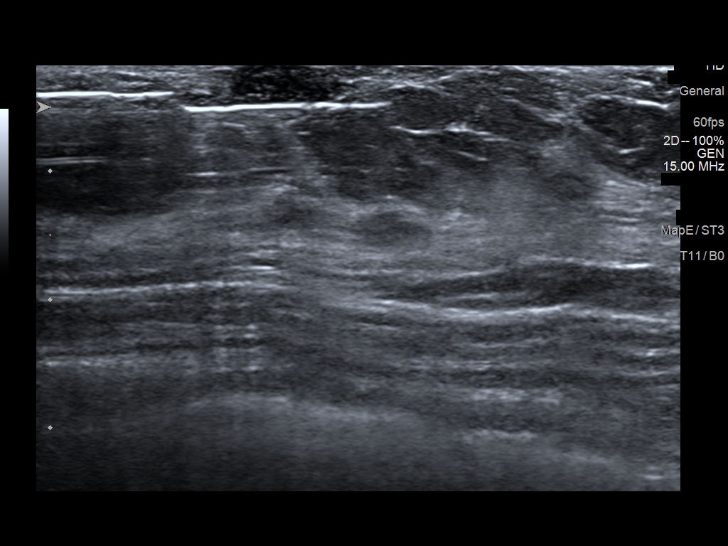
[im 7/11]
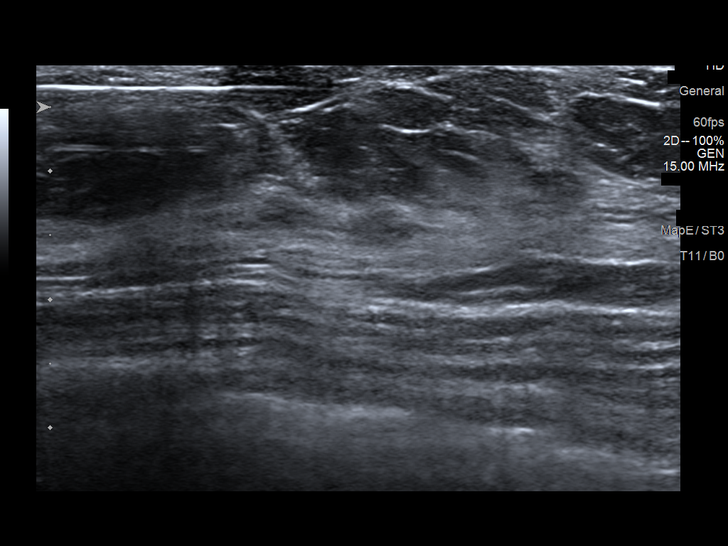
[im 8/11]
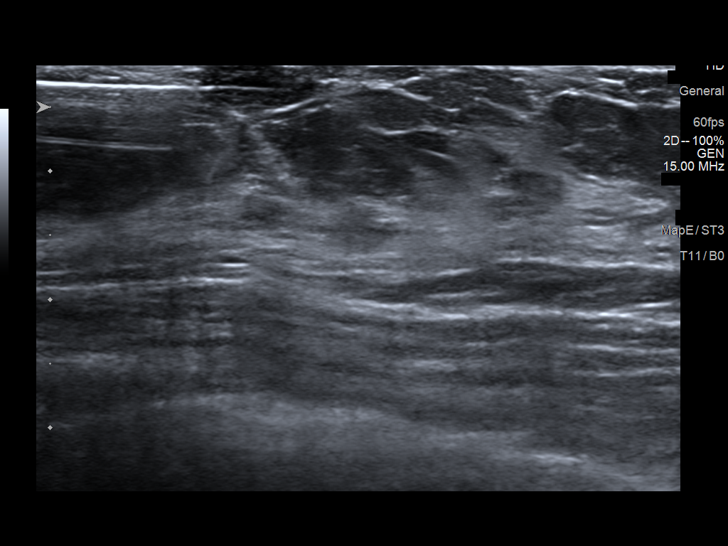
[im 9/11]
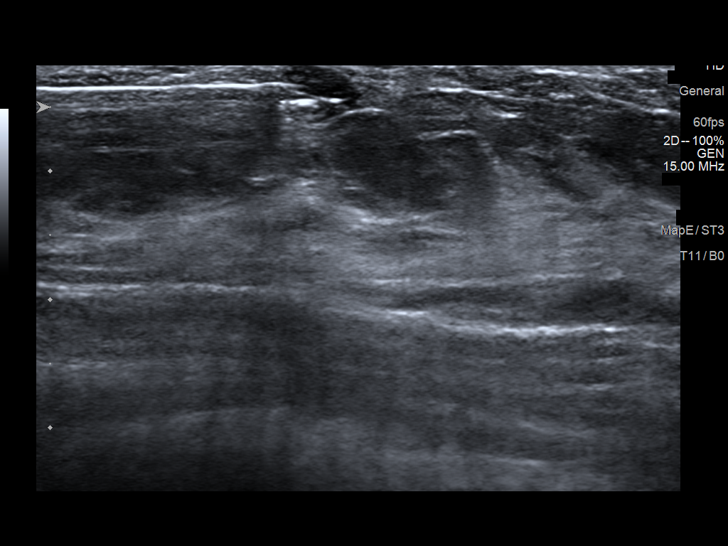
[im 10/11]
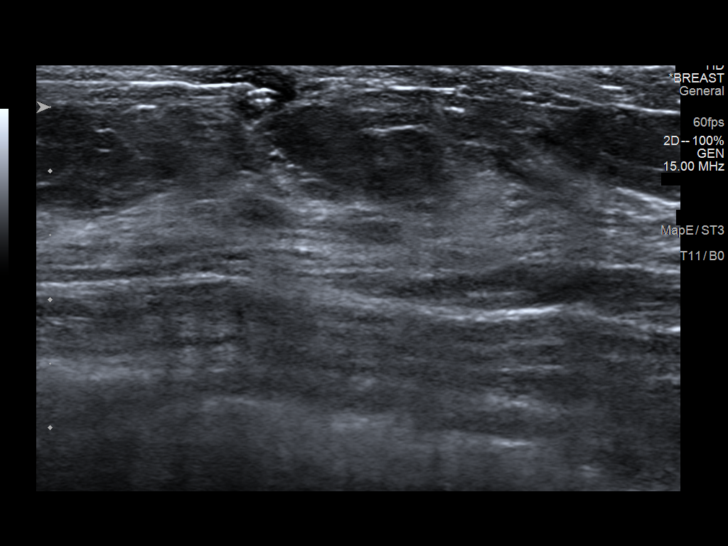
[im 11/11]
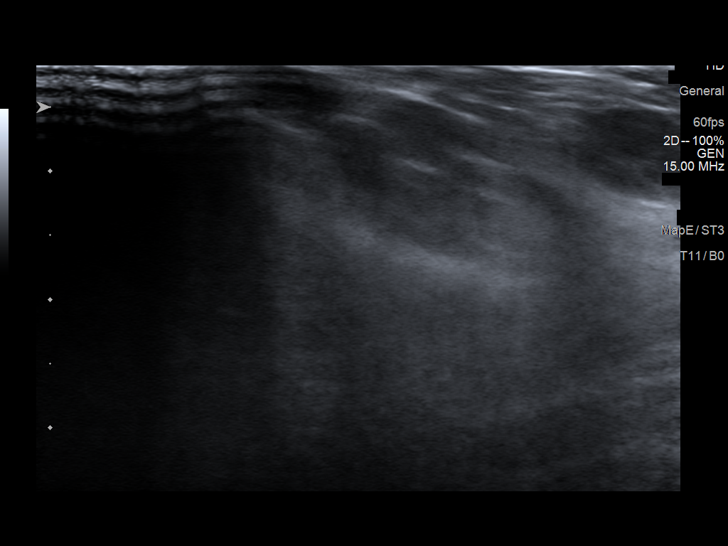

[11 of 11 positions shown; findings below may reference images not displayed]



Lesion quadrant: Upper-outer

Using sterile technique and 1% Lidocaine as local anesthetic, under
direct ultrasound visualization, a 14 gauge Henrik Juul device was
used to perform biopsy of the mass in the left breast at the 12
o'clock position using a medial to lateral approach. At the
conclusion of the procedure a ribbon shaped tissue marker clip was
deployed into the biopsy cavity. Follow up 2 view mammogram was
performed and dictated separately.
IMPRESSION: Ultrasound guided biopsy of the mass in the left breast at the 12
o'clock position. No apparent complications.

## 2018-06-25 ENCOUNTER — Other Ambulatory Visit: Payer: Self-pay | Admitting: Family Medicine

## 2018-06-25 DIAGNOSIS — Z1231 Encounter for screening mammogram for malignant neoplasm of breast: Secondary | ICD-10-CM

## 2018-07-03 DIAGNOSIS — Z0000 Encounter for general adult medical examination without abnormal findings: Secondary | ICD-10-CM | POA: Diagnosis not present

## 2018-07-03 DIAGNOSIS — D649 Anemia, unspecified: Secondary | ICD-10-CM | POA: Diagnosis not present

## 2018-07-03 DIAGNOSIS — R6889 Other general symptoms and signs: Secondary | ICD-10-CM | POA: Diagnosis not present

## 2018-07-16 ENCOUNTER — Ambulatory Visit: Payer: 59

## 2018-08-13 ENCOUNTER — Ambulatory Visit: Payer: 59

## 2018-09-18 DIAGNOSIS — E611 Iron deficiency: Secondary | ICD-10-CM | POA: Diagnosis not present

## 2018-09-18 DIAGNOSIS — Z01419 Encounter for gynecological examination (general) (routine) without abnormal findings: Secondary | ICD-10-CM | POA: Diagnosis not present

## 2018-09-21 ENCOUNTER — Ambulatory Visit
Admission: RE | Admit: 2018-09-21 | Discharge: 2018-09-21 | Disposition: A | Discharge status: Home / Self Care | Payer: 59 | Source: Ambulatory Visit | Attending: Family Medicine | Admitting: Family Medicine

## 2018-09-21 DIAGNOSIS — Z1231 Encounter for screening mammogram for malignant neoplasm of breast: Secondary | ICD-10-CM | POA: Diagnosis not present

## 2018-10-06 ENCOUNTER — Encounter (HOSPITAL_COMMUNITY): Payer: Self-pay

## 2019-10-08 ENCOUNTER — Other Ambulatory Visit: Payer: Self-pay | Admitting: Family Medicine

## 2019-10-08 DIAGNOSIS — Z1231 Encounter for screening mammogram for malignant neoplasm of breast: Secondary | ICD-10-CM

## 2019-11-29 ENCOUNTER — Other Ambulatory Visit: Payer: Self-pay

## 2019-11-29 ENCOUNTER — Ambulatory Visit
Admission: RE | Admit: 2019-11-29 | Discharge: 2019-11-29 | Disposition: A | Discharge status: Home / Self Care | Payer: BC Managed Care – PPO | Source: Ambulatory Visit | Attending: Family Medicine | Admitting: Family Medicine

## 2019-11-29 DIAGNOSIS — Z1231 Encounter for screening mammogram for malignant neoplasm of breast: Secondary | ICD-10-CM

## 2020-07-05 ENCOUNTER — Telehealth: Payer: Self-pay | Admitting: Hematology and Oncology

## 2020-07-05 NOTE — Telephone Encounter (Signed)
Received a new hem referral from Dr. Drema Dallas for abnormal CBC, increased lymphocyte count. Ms. Morgan Marks returned my call and has been scheduled to see Dr. Lorenso Courier on 8/6 at 1pm. Pt aware to arrive 15 minutes early.
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
## 2020-07-14 ENCOUNTER — Other Ambulatory Visit: Payer: Self-pay

## 2020-07-14 ENCOUNTER — Inpatient Hospital Stay: Payer: BC Managed Care – PPO | Attending: Hematology and Oncology | Admitting: Hematology and Oncology

## 2020-07-14 ENCOUNTER — Inpatient Hospital Stay: Payer: BC Managed Care – PPO

## 2020-07-14 ENCOUNTER — Encounter: Payer: Self-pay | Admitting: Hematology and Oncology

## 2020-07-14 VITALS — BP 125/79 | HR 123 | Temp 97.6°F | Resp 18 | Ht 64.0 in | Wt 178.8 lb

## 2020-07-14 DIAGNOSIS — D500 Iron deficiency anemia secondary to blood loss (chronic): Secondary | ICD-10-CM

## 2020-07-14 DIAGNOSIS — D509 Iron deficiency anemia, unspecified: Secondary | ICD-10-CM | POA: Insufficient documentation

## 2020-07-14 DIAGNOSIS — Z87828 Personal history of other (healed) physical injury and trauma: Secondary | ICD-10-CM | POA: Insufficient documentation

## 2020-07-14 DIAGNOSIS — Z803 Family history of malignant neoplasm of breast: Secondary | ICD-10-CM | POA: Diagnosis not present

## 2020-07-14 DIAGNOSIS — Z809 Family history of malignant neoplasm, unspecified: Secondary | ICD-10-CM | POA: Diagnosis not present

## 2020-07-14 DIAGNOSIS — D72820 Lymphocytosis (symptomatic): Secondary | ICD-10-CM | POA: Insufficient documentation

## 2020-07-14 DIAGNOSIS — N920 Excessive and frequent menstruation with regular cycle: Secondary | ICD-10-CM | POA: Insufficient documentation

## 2020-07-14 LAB — CBC WITH DIFFERENTIAL (CANCER CENTER ONLY)
Abs Immature Granulocytes: 0.03 10*3/uL (ref 0.00–0.07)
Basophils Absolute: 0.1 10*3/uL (ref 0.0–0.1)
Basophils Relative: 1 %
Eosinophils Absolute: 0.1 10*3/uL (ref 0.0–0.5)
Eosinophils Relative: 1 %
HCT: 40.3 % (ref 36.0–46.0)
Hemoglobin: 14.4 g/dL (ref 12.0–15.0)
Immature Granulocytes: 0 %
Lymphocytes Relative: 26 %
Lymphs Abs: 2 10*3/uL (ref 0.7–4.0)
MCH: 29.3 pg (ref 26.0–34.0)
MCHC: 35.7 g/dL (ref 30.0–36.0)
MCV: 81.9 fL (ref 80.0–100.0)
Monocytes Absolute: 0.5 10*3/uL (ref 0.1–1.0)
Monocytes Relative: 7 %
Neutro Abs: 5 10*3/uL (ref 1.7–7.7)
Neutrophils Relative %: 65 %
Platelet Count: 326 10*3/uL (ref 150–400)
RBC: 4.92 MIL/uL (ref 3.87–5.11)
RDW: 12.8 % (ref 11.5–15.5)
WBC Count: 7.6 10*3/uL (ref 4.0–10.5)
nRBC: 0 % (ref 0.0–0.2)

## 2020-07-14 LAB — CMP (CANCER CENTER ONLY)
ALT: 10 U/L (ref 0–44)
AST: 14 U/L — ABNORMAL LOW (ref 15–41)
Albumin: 4 g/dL (ref 3.5–5.0)
Alkaline Phosphatase: 83 U/L (ref 38–126)
Anion gap: 8 (ref 5–15)
BUN: 8 mg/dL (ref 6–20)
CO2: 26 mmol/L (ref 22–32)
Calcium: 9.6 mg/dL (ref 8.9–10.3)
Chloride: 108 mmol/L (ref 98–111)
Creatinine: 1.03 mg/dL — ABNORMAL HIGH (ref 0.44–1.00)
GFR, Est AFR Am: >60 mL/min (ref >60)
GFR, Estimated: >60 mL/min (ref >60)
Glucose, Bld: 103 mg/dL — ABNORMAL HIGH (ref 70–99)
Potassium: 4.2 mmol/L (ref 3.5–5.1)
Sodium: 142 mmol/L (ref 135–145)
Total Bilirubin: 0.3 mg/dL (ref 0.3–1.2)
Total Protein: 8.2 g/dL — ABNORMAL HIGH (ref 6.5–8.1)

## 2020-07-14 LAB — RETIC PANEL
Immature Retic Fract: 14.8 % (ref 2.3–15.9)
RBC.: 5 MIL/uL (ref 3.87–5.11)
Retic Count, Absolute: 70 10*3/uL (ref 19.0–186.0)
Retic Ct Pct: 1.4 % (ref 0.4–3.1)
Reticulocyte Hemoglobin: 34.1 pg (ref >27.9)

## 2020-07-14 LAB — SEDIMENTATION RATE: Sed Rate: 9 mm/hr (ref 0–22)

## 2020-07-14 LAB — C-REACTIVE PROTEIN: CRP: <0.5 mg/dL (ref <1.0)

## 2020-07-14 LAB — SAVE SMEAR (SSMR)

## 2020-07-14 LAB — LACTATE DEHYDROGENASE: LDH: 155 U/L (ref 98–192)

## 2020-07-17 LAB — SURGICAL PATHOLOGY

## 2020-07-17 LAB — IRON AND TIBC
Iron: 56 ug/dL (ref 41–142)
Saturation Ratios: 25 % (ref 21–57)
TIBC: 226 ug/dL — ABNORMAL LOW (ref 236–444)
UIBC: 171 ug/dL (ref 120–384)

## 2020-07-17 LAB — FERRITIN: Ferritin: 347 ng/mL — ABNORMAL HIGH (ref 11–307)

## 2020-07-17 LAB — FLOW CYTOMETRY

## 2020-07-18 ENCOUNTER — Telehealth: Payer: Self-pay | Admitting: Hematology and Oncology

## 2020-07-18 NOTE — Telephone Encounter (Signed)
Scheduled per los. Called, not able to leave msg. Mailed printout  

## 2020-07-21 ENCOUNTER — Telehealth: Payer: Self-pay | Admitting: *Deleted

## 2020-07-21 NOTE — Telephone Encounter (Signed)
TCT to patient regarding her lab results from 07/14/20. Spoke with patient and informed her that Dr. Lorenso Courier found no evidence of lymphoma or any other blood disorders. He believes she had a transient elevation that has since resolved. She was very pleased with this. Advised that we will cancel her 6 moth f/u. She voiced understanding

## 2020-07-21 NOTE — Telephone Encounter (Signed)
-----   Message from Orson Slick, MD sent at 07/21/2020  9:34 AM EDT ----- Please let Morgan Marks know that her lymphocyte count has returned to normal. We found no evidence of lymphoma or other blood disorders on her work up. Her elevation in lymphocytes may have been a transient elevation which has now normalized.   We can cancel our 6 months f/u as her blood issue has resolved (please send request to cancel). We are happy to see her back if she were to have changes in her blood counts in future.  ----- Message ----- From: Interface, Lab In [place] Sent: 07/14/2020   2:21 PM EDT To: Orson Slick, MD

## 2020-12-19 ENCOUNTER — Emergency Department (HOSPITAL_COMMUNITY): Payer: BC Managed Care – PPO

## 2020-12-19 ENCOUNTER — Emergency Department (HOSPITAL_COMMUNITY)
Admission: EM | Admit: 2020-12-19 | Discharge: 2020-12-19 | Disposition: A | Discharge status: Home / Self Care | Payer: BC Managed Care – PPO | Attending: Emergency Medicine | Admitting: Emergency Medicine

## 2020-12-19 ENCOUNTER — Encounter (HOSPITAL_COMMUNITY): Payer: Self-pay | Admitting: Emergency Medicine

## 2020-12-19 ENCOUNTER — Other Ambulatory Visit: Payer: Self-pay

## 2020-12-19 DIAGNOSIS — R0602 Shortness of breath: Secondary | ICD-10-CM | POA: Diagnosis not present

## 2020-12-19 DIAGNOSIS — R5383 Other fatigue: Secondary | ICD-10-CM | POA: Diagnosis present

## 2020-12-19 DIAGNOSIS — R0789 Other chest pain: Secondary | ICD-10-CM | POA: Insufficient documentation

## 2020-12-19 DIAGNOSIS — Z8616 Personal history of COVID-19: Secondary | ICD-10-CM | POA: Insufficient documentation

## 2020-12-19 LAB — CBC WITH DIFFERENTIAL/PLATELET
Abs Immature Granulocytes: 0.03 10*3/uL (ref 0.00–0.07)
Basophils Absolute: 0 10*3/uL (ref 0.0–0.1)
Basophils Relative: 0 %
Eosinophils Absolute: 0.1 10*3/uL (ref 0.0–0.5)
Eosinophils Relative: 1 %
HCT: 42 % (ref 36.0–46.0)
Hemoglobin: 14.9 g/dL (ref 12.0–15.0)
Immature Granulocytes: 0 %
Lymphocytes Relative: 47 %
Lymphs Abs: 3.2 10*3/uL (ref 0.7–4.0)
MCH: 28.9 pg (ref 26.0–34.0)
MCHC: 35.5 g/dL (ref 30.0–36.0)
MCV: 81.4 fL (ref 80.0–100.0)
Monocytes Absolute: 0.4 10*3/uL (ref 0.1–1.0)
Monocytes Relative: 6 %
Neutro Abs: 3.2 10*3/uL (ref 1.7–7.7)
Neutrophils Relative %: 46 %
Platelets: 265 10*3/uL (ref 150–400)
RBC: 5.16 MIL/uL — ABNORMAL HIGH (ref 3.87–5.11)
RDW: 12.9 % (ref 11.5–15.5)
WBC: 6.9 10*3/uL (ref 4.0–10.5)
nRBC: 0 % (ref 0.0–0.2)

## 2020-12-19 LAB — BASIC METABOLIC PANEL
Anion gap: 11 (ref 5–15)
BUN: 7 mg/dL (ref 6–20)
CO2: 23 mmol/L (ref 22–32)
Calcium: 9.1 mg/dL (ref 8.9–10.3)
Chloride: 104 mmol/L (ref 98–111)
Creatinine, Ser: 0.58 mg/dL (ref 0.44–1.00)
GFR, Estimated: >60 mL/min (ref >60)
Glucose, Bld: 86 mg/dL (ref 70–99)
Potassium: 3.7 mmol/L (ref 3.5–5.1)
Sodium: 138 mmol/L (ref 135–145)

## 2020-12-19 LAB — TROPONIN I (HIGH SENSITIVITY): Troponin I (High Sensitivity): <2 ng/L (ref <18)

## 2020-12-19 MED ORDER — SODIUM CHLORIDE 0.9 % IV BOLUS
500.0000 mL | Freq: Once | INTRAVENOUS | Status: AC
  Administered 2020-12-19: 500 mL via INTRAVENOUS

## 2020-12-19 MED ORDER — ONDANSETRON HCL 4 MG PO TABS: 4.0000 mg | ORAL_TABLET | Freq: Four times a day (QID) | ORAL | 0 refills | Status: AC

## 2020-12-19 MED ORDER — ONDANSETRON HCL 4 MG/2ML IJ SOLN
4.0000 mg | Freq: Once | INTRAMUSCULAR | Status: AC
  Administered 2020-12-19: 4 mg via INTRAVENOUS
  Filled 2020-12-19: qty 2

## 2020-12-19 NOTE — Discharge Instructions (Addendum)
seen for weakness and shortness of breath.  Lab work and imaging looked reassuring.  I have given you prescription for Zofran, it will help you with nausea please take as needed.  I would like you to hydrate as this will help you feel better.  I recommend water or Gatorade, I also recommend soups as this will provide you with calories and fluids since you do not have much of an appetite.  Recommend he follow-up with your primary care provider for further evaluation if needed  Come back to the emergency department if you develop chest pain, shortness of breath, severe abdominal pain, uncontrolled nausea, vomiting, diarrhea.

## 2020-12-19 NOTE — ED Triage Notes (Signed)
Per pt, states she was diagnosed with Covid last Wednesday...states she went back to work today-states increased fatigue and "winded" when she ambulates

## 2020-12-19 NOTE — ED Provider Notes (Signed)
[facility] DEPT Provider Note   CSN: [id number] Arrival date & time: 12/19/20  2992     History Chief Complaint  Patient presents with  . Fatigue    Morgan Marks is a 45 y.o. female.  HPI   Patient with no significant medical history presents to the emergency department with chief complaint of chest pressure, shortness of breath and feeling fatigued.  Patient endorses that she was initially diagnosed with COVID on the first of the month, Symptoms have since resolved, but she states she has felt more fatigued.  She went to work yesterday and she felt very fatigued and winded while walking around.  She endorses some chest tightness, but denies chest pain, becoming nauseous or vomiting, diaphoretic.  Patient has no cardiac history, no history of DVTs or PEs, she is not on birth control, denies recent immobilization or surgeries.  Patient endorses  that she feels like she is just dehydrated, she states she has not had an appetite and has not been drinking much fluids.  feels this is the cause of her symptoms.  She is adamant that she needs IV fluids in order  to get better.  She has no other complaints at this time.  Patient denies headaches, fevers, chills, congestion, sore throat, abdominal pain, nausea, vomiting, diarrhea, pedal edema.  Past Medical History:  Diagnosis Date  . MRSA (methicillin resistant staph aureus) culture positive     Patient Active Problem List   Diagnosis Date Noted  . MRSA (methicillin resistant staph aureus) culture positive     Past Surgical History:  Procedure Laterality Date  . BREAST BIOPSY Left   . CESAREAN SECTION       OB History   No obstetric history on file.     Family History  Problem Relation Age of Onset  . Breast cancer Paternal Grandmother   . Diabetes Mother   . Cancer Mother   . Hypertension Mother     Social History   Tobacco Use  . Smoking status: Never Smoker  Substance Use Topics   . Alcohol use: No  . Drug use: No    Home Medications Prior to Admission medications   Medication Sig Start Date End Date Taking? Authorizing Provider  ondansetron (ZOFRAN) 4 MG tablet Take 1 tablet (4 mg total) by mouth every 6 (six) hours. 12/19/20  Yes Marcello Fennel, PA-C  acetaminophen (TYLENOL) 325 MG tablet Take 650 mg by mouth every 6 (six) hours as needed.    [provider]  ELDERBERRY PO Take by mouth.    [provider]  Multiple Vitamins-Minerals (WOMENS MULTIVITAMIN PO) Take by mouth.    [provider]    Allergies    Topical sulfur  Review of Systems   Review of Systems  Constitutional: Positive for fatigue. Negative for chills and fever.  HENT: Negative for congestion and sore throat.   Respiratory: Positive for chest tightness. Negative for shortness of breath.   Cardiovascular: Negative for chest pain.  Gastrointestinal: Negative for abdominal pain, diarrhea, nausea and vomiting.  Genitourinary: Negative for enuresis.  Musculoskeletal: Negative for back pain.  Skin: Negative for rash.  Neurological: Negative for dizziness.  Hematological: Does not bruise/bleed easily.    Physical Exam Updated Vital Signs BP 120/71   Pulse 81   Temp 98.7 F (37.1 C) (Oral)   Resp 18   Ht 5\' 4"  (1.626 m)   Wt 79.4 kg   SpO2 100%   BMI 30.04 kg/m  Physical Exam Vitals and nursing note reviewed.  Constitutional:      General: She is not in acute distress.    Appearance: She is not ill-appearing.  HENT:     Head: Normocephalic and atraumatic.     Right Ear: Tympanic membrane, ear canal and external ear normal.     Left Ear: Tympanic membrane, ear canal and external ear normal.     Nose: No congestion or rhinorrhea.     Mouth/Throat:     Mouth: Mucous membranes are moist.     Pharynx: Oropharynx is clear. No oropharyngeal exudate or posterior oropharyngeal erythema.  Eyes:     Conjunctiva/sclera: Conjunctivae normal.   Cardiovascular:     Rate and Rhythm: Normal rate and regular rhythm.     Pulses: Normal pulses.     Heart sounds: No murmur heard. No friction rub. No gallop.   Pulmonary:     Effort: No respiratory distress.     Breath sounds: No wheezing, rhonchi or rales.  Abdominal:     Palpations: Abdomen is soft.     Tenderness: There is no abdominal tenderness.  Musculoskeletal:     Comments: Patient is moving all 4 extremities with difficulty.  Skin:    General: Skin is warm and dry.  Neurological:     Mental Status: She is alert.  Psychiatric:        Mood and Affect: Mood normal.     ED Results / Procedures / Treatments   Labs (all labs ordered are listed, but only abnormal results are displayed) Labs Reviewed  CBC WITH DIFFERENTIAL/PLATELET - Abnormal; Notable for the following components:      Result Value   RBC 5.16 (*)    All other components within normal limits  BASIC METABOLIC PANEL  TROPONIN I (HIGH SENSITIVITY)    EKG EKG Interpretation  Date/Time:  Tuesday December 19 2020 16:20:35 EST Ventricular Rate:  74 PR Interval:    QRS Duration: 81 QT Interval:  401 QTC Calculation: 442 R Axis:   80 Text Interpretation: Sinus rhythm ST elev, probable normal early repol pattern 12 Lead; Mason-Likar since last tracing no significant change Confirmed by Daleen Bo [phone]) on 12/19/2020 5:08:35 PM   Radiology DG Chest Port 1 View  Result Date: 12/19/2020 CLINICAL DATA:  Shortness of breath. Reported recent COVID-19 positive EXAM: PORTABLE CHEST 1 VIEW COMPARISON:  July 10, 2015 FINDINGS: Lungs are clear. Heart size and pulmonary vascularity are normal. No adenopathy. No bone lesions. IMPRESSION: Lungs clear.  Cardiac silhouette normal. Electronically Signed   By: Lowella Grip III M.D.   On: 12/19/2020 16:12    Procedures Procedures (including critical care time)  Medications Ordered in ED Medications  sodium chloride 0.9 % bolus 500 mL (500 mLs Intravenous New  Bag/Given 12/19/20 1559)  ondansetron (ZOFRAN) injection 4 mg (4 mg Intravenous Given 12/19/20 1600)    ED Course  I have reviewed the triage vital signs and the nursing notes.  Pertinent labs & imaging results that were available during my care of the patient were reviewed by me and considered in my medical decision making (see chart for details).    MDM Rules/Calculators/A&P                          Patient presents with chief complaint of feeling fatigued and dehydrated.  She is alert, does not appear in acute distress, vital signs reassuring.  Will obtain basic lab work-up, chest x-ray EKG  and provide her with fluids and reevaluate.  Patient is reassessed after providing her with fluids, Zofran and states she is feeling much better.  She is tolerating p.o., vital signs have remained stable.   CBC negative for leukocytosis or signs of anemia.  CBC shows no electrolyte abnormalities, no AKI, no anion gap present.  Initial  troponin is less than 2, EKG sinus rhythm without signs of ischemia no ST elevation depression noted. Patient's orthostatics  unremarkable at this time. Chest x-ray does not reveal any acute findings.  I have low suspicion for ACS as history is atypical, patient has no cardiac history, EKG was sinus rhythm without signs of ischemia, initial troponin is less than 2.  Will defer second opponent at this time as patient has had chest pain greater than 12 hours, acute cardiac abnormalities unlikely with a troponin less than 2.  Low suspicion for PE as patient denies pleuritic chest pain, shortness of breath, patient denies leg pain, no pedal edema noted on exam, patient was PERC negative.  Low suspicion for AAA or aortic dissection as history is atypical, patient has low risk factors.  Low suspicion for systemic infection as patient is nontoxic-appearing, vital signs reassuring, no obvious source infection noted on exam.  I suspect patient is still suffering from COVID-like  symptoms as she endorses decreased appetite and nausea which  lead to decreased calorie and fluid intake making her feel fatigued.  I will provide her with Zofran for nausea educated her on fluid hydration and to have her follow-up with PCP for further evaluation.  Vital signs have remained stable, no indication for hospital admission.  Patient discussed with attending and they agreed with assessment and plan.  Patient given at home care as well strict return precautions.  Patient verbalized that they understood agreed to said plan.    Final Clinical Impression(s) / ED Diagnoses Final diagnoses:  Fatigue, unspecified type    Rx / DC Orders ED Discharge Orders         Ordered    ondansetron (ZOFRAN) 4 MG tablet  Every 6 hours        12/19/20 1726           Marcello Fennel, PA-C 12/19/20 1738    Daleen Bo, MD 12/21/20 1147

## 2021-01-18 ENCOUNTER — Ambulatory Visit: Payer: BC Managed Care – PPO | Admitting: Hematology and Oncology

## 2021-01-18 ENCOUNTER — Other Ambulatory Visit: Payer: BC Managed Care – PPO

## 2021-03-19 ENCOUNTER — Other Ambulatory Visit: Payer: Self-pay | Admitting: Family Medicine

## 2021-03-19 DIAGNOSIS — Z1231 Encounter for screening mammogram for malignant neoplasm of breast: Secondary | ICD-10-CM

## 2021-05-16 ENCOUNTER — Ambulatory Visit
Admission: RE | Admit: 2021-05-16 | Discharge: 2021-05-16 | Disposition: A | Discharge status: Home / Self Care | Payer: BC Managed Care – PPO | Source: Ambulatory Visit | Attending: Family Medicine | Admitting: Family Medicine

## 2021-05-16 ENCOUNTER — Other Ambulatory Visit: Payer: Self-pay

## 2021-05-16 DIAGNOSIS — Z1231 Encounter for screening mammogram for malignant neoplasm of breast: Secondary | ICD-10-CM

## 2022-01-23 ENCOUNTER — Other Ambulatory Visit: Payer: Self-pay | Admitting: Obstetrics and Gynecology

## 2022-01-23 DIAGNOSIS — N6323 Unspecified lump in the left breast, lower outer quadrant: Secondary | ICD-10-CM

## 2022-01-30 ENCOUNTER — Ambulatory Visit
Admission: RE | Admit: 2022-01-30 | Discharge: 2022-01-30 | Disposition: A | Discharge status: Home / Self Care | Payer: BC Managed Care – PPO | Source: Ambulatory Visit | Attending: Obstetrics and Gynecology | Admitting: Obstetrics and Gynecology

## 2022-01-30 ENCOUNTER — Ambulatory Visit
Admission: RE | Admit: 2022-01-30 | Discharge: 2022-01-30 | Disposition: A | Discharge status: Home / Self Care | Payer: 59 | Source: Ambulatory Visit | Attending: Obstetrics and Gynecology | Admitting: Obstetrics and Gynecology

## 2022-01-30 DIAGNOSIS — N6323 Unspecified lump in the left breast, lower outer quadrant: Secondary | ICD-10-CM

## 2023-03-03 ENCOUNTER — Other Ambulatory Visit: Payer: Self-pay | Admitting: Obstetrics and Gynecology

## 2023-03-03 DIAGNOSIS — Z1231 Encounter for screening mammogram for malignant neoplasm of breast: Secondary | ICD-10-CM

## 2023-04-16 ENCOUNTER — Ambulatory Visit
Admission: RE | Admit: 2023-04-16 | Discharge: 2023-04-16 | Disposition: A | Discharge status: Home / Self Care | Payer: 59 | Source: Ambulatory Visit | Attending: Obstetrics and Gynecology | Admitting: Obstetrics and Gynecology

## 2023-04-16 DIAGNOSIS — Z1231 Encounter for screening mammogram for malignant neoplasm of breast: Secondary | ICD-10-CM

## 2023-09-26 ENCOUNTER — Other Ambulatory Visit (HOSPITAL_COMMUNITY)
Admission: RE | Admit: 2023-09-26 | Discharge: 2023-09-26 | Disposition: A | Discharge status: Home / Self Care | Payer: 59 | Source: Ambulatory Visit | Attending: Obstetrics and Gynecology | Admitting: Obstetrics and Gynecology

## 2023-09-26 DIAGNOSIS — Z01419 Encounter for gynecological examination (general) (routine) without abnormal findings: Secondary | ICD-10-CM | POA: Diagnosis present

## 2023-09-29 ENCOUNTER — Other Ambulatory Visit: Payer: Self-pay | Admitting: Obstetrics and Gynecology

## 2023-09-29 DIAGNOSIS — N6459 Other signs and symptoms in breast: Secondary | ICD-10-CM

## 2023-10-02 LAB — CYTOLOGY - PAP
Comment: NEGATIVE
Diagnosis: NEGATIVE
High risk HPV: NEGATIVE

## 2023-10-22 ENCOUNTER — Ambulatory Visit
Admission: RE | Admit: 2023-10-22 | Discharge: 2023-10-22 | Disposition: A | Discharge status: Home / Self Care | Payer: 59 | Source: Ambulatory Visit | Attending: Obstetrics and Gynecology | Admitting: Obstetrics and Gynecology

## 2023-10-22 DIAGNOSIS — N6459 Other signs and symptoms in breast: Secondary | ICD-10-CM

## 2024-02-02 IMAGING — MG MM DIGITAL DIAGNOSTIC UNILAT*L* W/ TOMO W/ CAD
6 of 10 series · 6 of 30 positions shown · non-contrast
Comparison: Previous exam(s).

CLINICAL DATA: Palpable abnormality noted in the LOWER OUTER
QUADRANT of the LEFT breast on recent physical exam. Patient does
not feel the mass herself. Patient had ultrasound-guided core biopsy
of mass in the 12 o'clock location of the LEFT breast performed
07/25/2017, showing benign concordant fibroadenoma.

EXAM:
DIGITAL DIAGNOSTIC UNILATERAL LEFT MAMMOGRAM WITH TOMOSYNTHESIS AND
CAD; ULTRASOUND LEFT BREAST LIMITED
TECHNIQUE: Left digital diagnostic mammography and breast tomosynthesis was
performed. The images were evaluated with computer-aided detection.;
Targeted ultrasound examination of the left breast was performed.

[L CC synth-2D (1 of 3)]
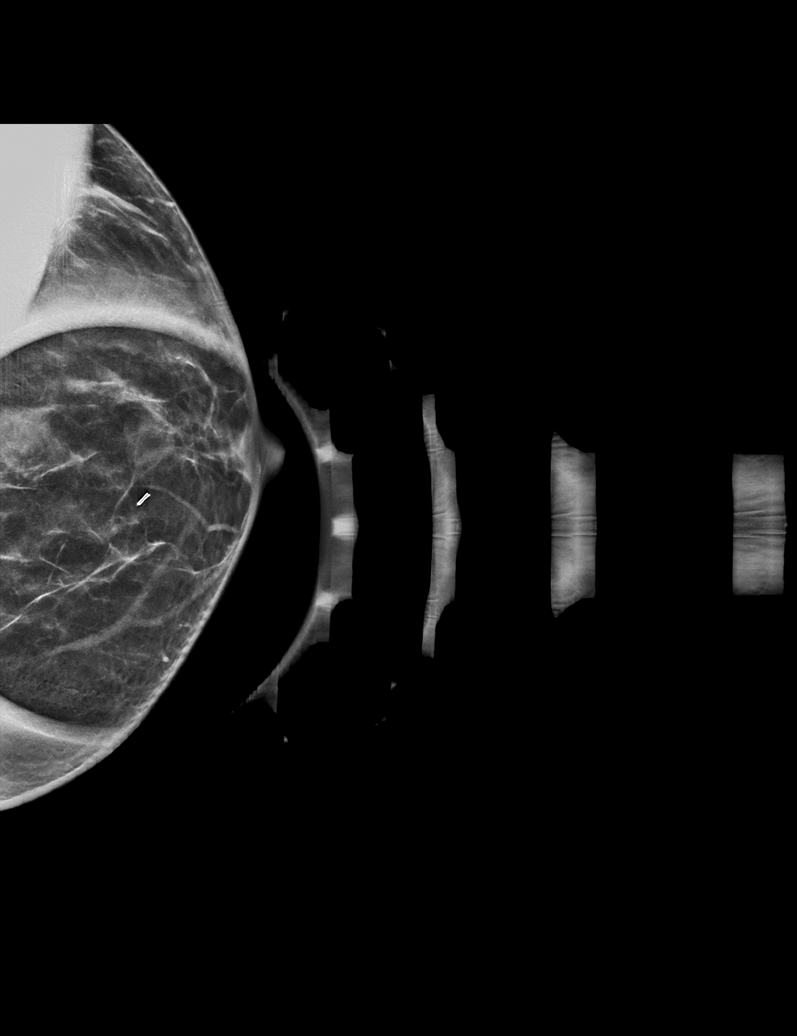

[L CC synth-2D (2 of 3)]
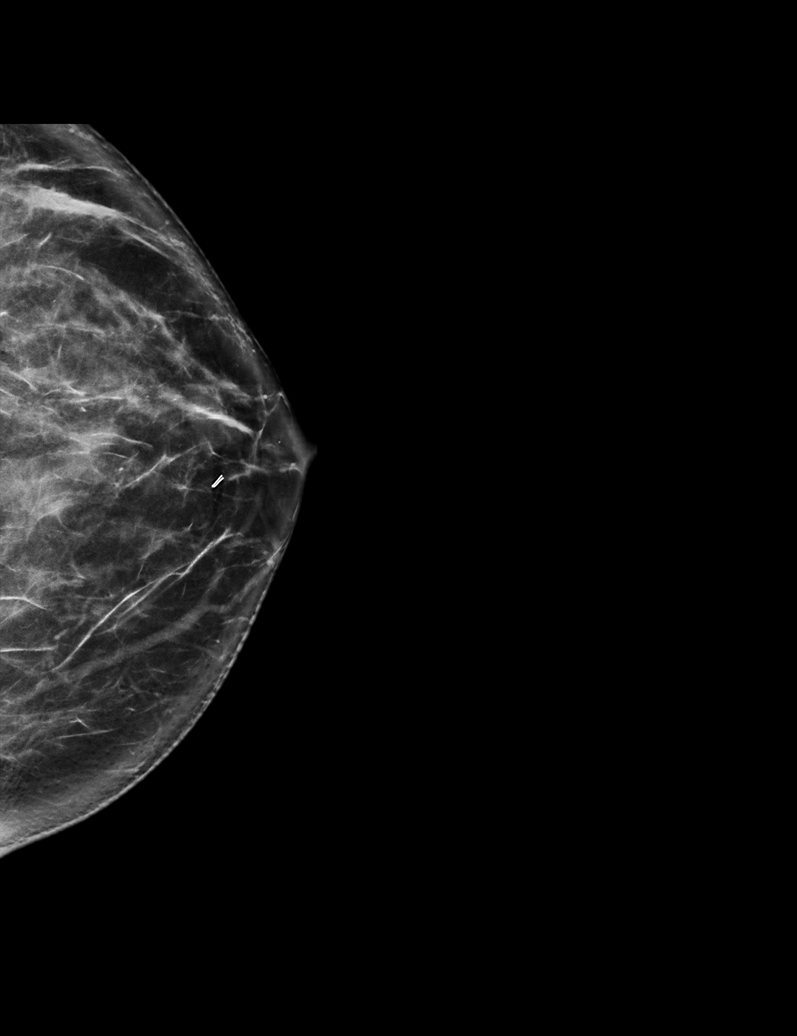

[L MLO synth-2D (1 of 2)]
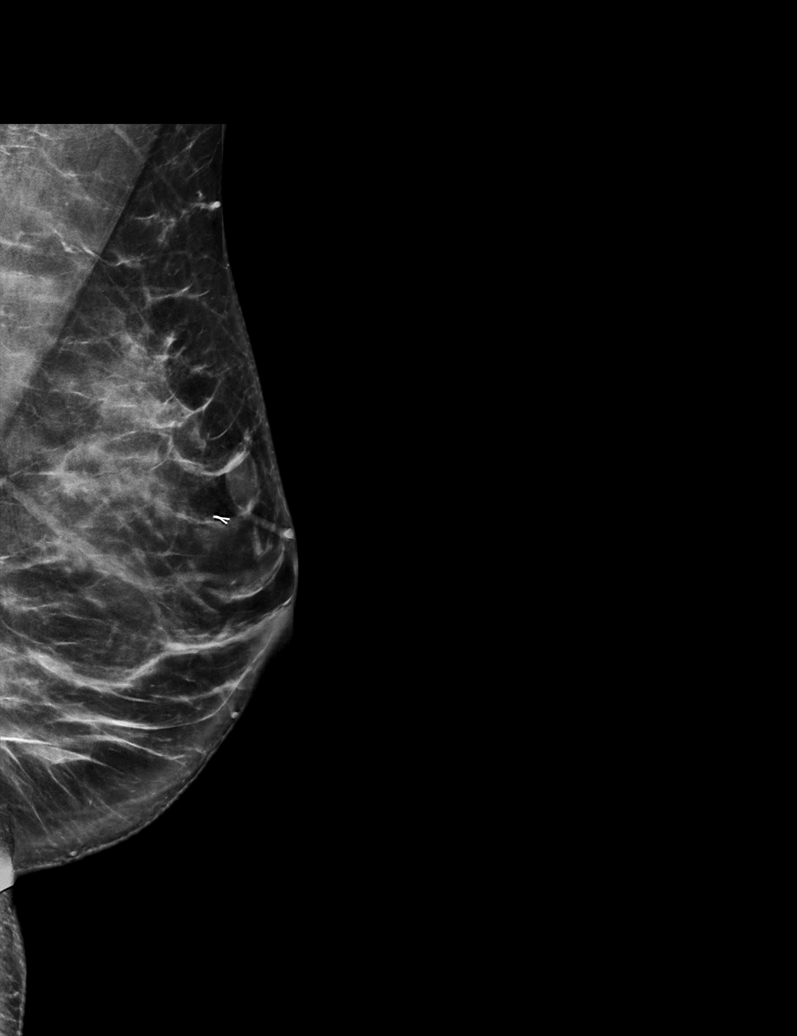

[L MLO synth-2D (2 of 2)]
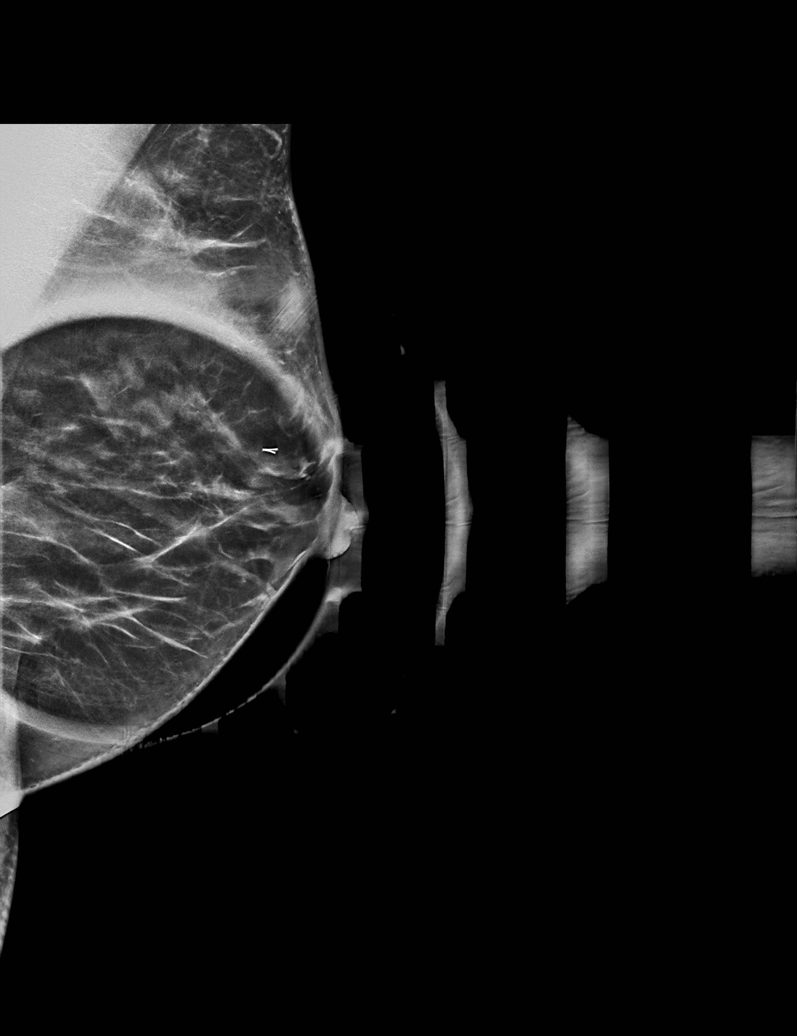

[L CC synth-2D (3 of 3)]
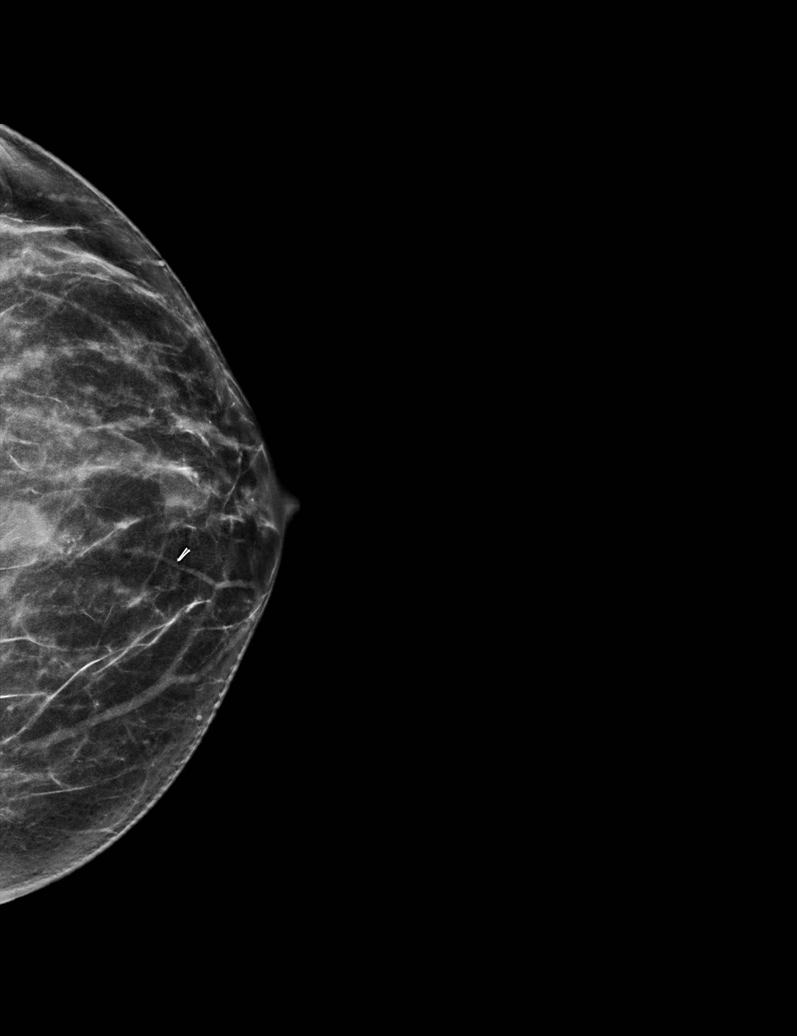

[L MLO tomo · tomo slice 31/62.0]
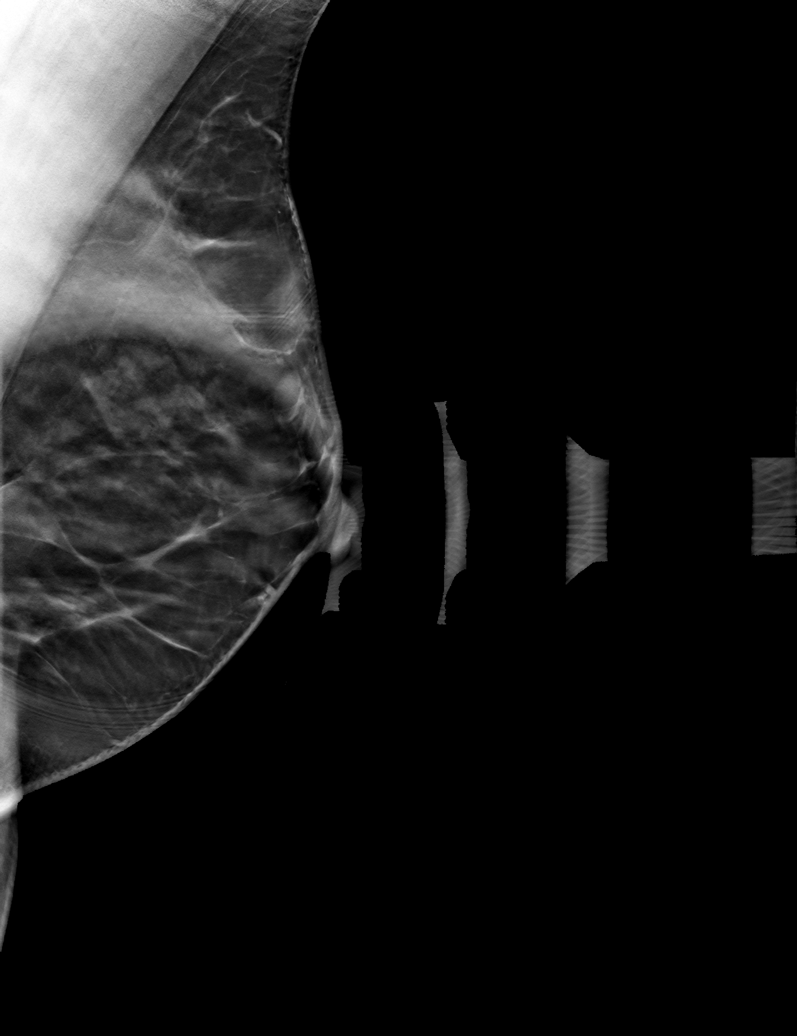

[6 of 30 positions shown; findings below may reference images not displayed]

ACR Breast Density Category c: The breast tissue is heterogeneously
dense, which may obscure small masses.
FINDINGS: Previously biopsied mass in the 12 o'clock location of the LEFT
breast appear stable. An adjacent ribbon shaped clip is noted. A
partially obscured mass is identified in the LOWER central portion
of the LEFT breast and confirmed on spot compression views.

On physical exam, I palpate a mobile smooth mass in the 5:30 o'clock
location of the LEFT breast.

Targeted ultrasound is performed, showing a simple cyst in the 5:30
o'clock location of the LEFT breast 2 centimeters from the nipple
correlating with the palpable finding. Cyst is 1.4 x 1.2 x
centimeters. Incidental note is made of a 0.4 centimeter cyst in the
3 o'clock location. No solid mass or areas of acoustic shadowing.
IMPRESSION: Palpable benign cyst in the 5:30 o'clock location of the LEFT
breast. No mammographic or ultrasound evidence for malignancy.

RECOMMENDATION:
Screening mammogram in one year.(Code:[id number])

I have discussed the findings and recommendations with the patient.
If applicable, a reminder letter will be sent to the patient
regarding the next appointment.

BI-RADS CATEGORY  2: Benign.

## 2024-10-16 ENCOUNTER — Other Ambulatory Visit: Payer: Self-pay

## 2024-10-16 ENCOUNTER — Emergency Department (HOSPITAL_BASED_OUTPATIENT_CLINIC_OR_DEPARTMENT_OTHER): Admitting: Radiology

## 2024-10-16 ENCOUNTER — Emergency Department (HOSPITAL_BASED_OUTPATIENT_CLINIC_OR_DEPARTMENT_OTHER)
Admission: EM | Admit: 2024-10-16 | Discharge: 2024-10-16 | Disposition: A | Discharge status: Home / Self Care | Attending: Emergency Medicine | Admitting: Emergency Medicine

## 2024-10-16 ENCOUNTER — Encounter (HOSPITAL_BASED_OUTPATIENT_CLINIC_OR_DEPARTMENT_OTHER): Payer: Self-pay

## 2024-10-16 DIAGNOSIS — J069 Acute upper respiratory infection, unspecified: Secondary | ICD-10-CM | POA: Diagnosis not present

## 2024-10-16 DIAGNOSIS — J040 Acute laryngitis: Secondary | ICD-10-CM | POA: Insufficient documentation

## 2024-10-16 DIAGNOSIS — R059 Cough, unspecified: Secondary | ICD-10-CM | POA: Diagnosis not present

## 2024-10-16 LAB — RESP PANEL BY RT-PCR (RSV, FLU A&B, COVID)  RVPGX2
Influenza A by PCR: NEGATIVE
Influenza B by PCR: NEGATIVE
Resp Syncytial Virus by PCR: NEGATIVE
SARS Coronavirus 2 by RT PCR: NEGATIVE

## 2024-10-16 MED ORDER — GUAIFENESIN 200 MG PO TABS: 200.0000 mg | ORAL_TABLET | ORAL | 0 refills | Status: AC | PRN

## 2024-10-16 MED ORDER — PREDNISONE 20 MG PO TABS: ORAL_TABLET | ORAL | 0 refills | Status: AC

## 2024-10-16 MED ORDER — BENZONATATE 100 MG PO CAPS: 100.0000 mg | ORAL_CAPSULE | Freq: Three times a day (TID) | ORAL | 0 refills | Status: AC

## 2024-10-16 MED ORDER — AZITHROMYCIN 250 MG PO TABS: ORAL_TABLET | ORAL | 0 refills | Status: AC

## 2024-10-16 NOTE — ED Triage Notes (Signed)
 Pt reports cough, itchy throat, fatigue, runny nose since Tuesday.

## 2024-10-16 NOTE — ED Provider Notes (Signed)
 [place] EMERGENCY DEPARTMENT AT [facility] Provider Note   CSN: [id number] Arrival date & time: 10/16/24  1647     Patient presents with: Cough   Morgan Marks is a 48 y.o. female.   The history is provided by the patient and medical records. No language interpreter was used.  Cough    48 year old female presenting with cold symptoms.  Patient states for the past 4 days she has had sinus congestion, throat irritation, hoarseness, productive cough, and generalized fatigue.  She does not endorse any fever chills or bodyaches she denies any shortness of breath no nausea vomiting or diarrhea.  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  She tries taking Mucinex and over-the-counter medication without adequate relief.  She denies alcohol or tobacco use.  No prior history of PE or DVT.  Prior to Admission medications   Medication Sig Start Date End Date Taking? Authorizing Provider  acetaminophen  (TYLENOL ) 325 MG tablet Take 650 mg by mouth every 6 (six) hours as needed.    [provider]  ELDERBERRY PO Take by mouth.    [provider]  Multiple Vitamins-Minerals (WOMENS MULTIVITAMIN PO) Take by mouth.    [provider]  ondansetron  (ZOFRAN ) 4 MG tablet Take 1 tablet (4 mg total) by mouth every 6 (six) hours. 12/19/20   Waylan Elsie PARAS, PA-C    Allergies: Topical sulfur    Review of Systems  Respiratory:  Positive for cough.   All other systems reviewed and are negative.   Updated Vital Signs BP (!) 130/98 (BP Location: Right Arm)   Pulse (!) 107   Temp 98.9 F (37.2 C)   Resp 18   Ht 5' 4 (1.626 m)   Wt 74.8 kg   SpO2 100%   BMI 28.32 kg/m   Physical Exam Vitals and nursing note reviewed.  Constitutional:      General: She is not in acute distress.    Appearance: She is well-developed.  HENT:     Head: Atraumatic.     Nose: Nose normal.     Mouth/Throat:     Mouth:  Mucous membranes are moist.  Eyes:     Conjunctiva/sclera: Conjunctivae normal.  Neck:     Vascular: No carotid bruit.  Cardiovascular:     Rate and Rhythm: Tachycardia present.     Pulses: Normal pulses.     Heart sounds: Normal heart sounds.  Pulmonary:     Effort: Pulmonary effort is normal.     Breath sounds: No wheezing, rhonchi or rales.  Abdominal:     Palpations: Abdomen is soft.     Tenderness: There is no abdominal tenderness.  Musculoskeletal:     Cervical back: Normal range of motion and neck supple. No rigidity or tenderness.  Lymphadenopathy:     Cervical: No cervical adenopathy.  Skin:    Findings: No rash.  Neurological:     Mental Status: She is alert.  Psychiatric:        Mood and Affect: Mood normal.     (all labs ordered are listed, but only abnormal results are displayed) Labs Reviewed  RESP PANEL BY RT-PCR (RSV, FLU A&B, COVID)  [id number]    EKG: None  Radiology: DG Chest 2 View Result Date: 10/16/2024 EXAM: 2 VIEW(S) XRAY OF THE CHEST 10/16/2024 06:36:00 PM COMPARISON: 1 / T11 / 22 CLINICAL HISTORY: cough FINDINGS: LUNGS AND PLEURA: No focal pulmonary opacity. No pulmonary  edema. No pleural effusion. No pneumothorax. HEART AND MEDIASTINUM: No acute abnormality of the cardiac and mediastinal silhouettes. BONES AND SOFT TISSUES: No acute osseous abnormality. IMPRESSION: 1. No acute cardiopulmonary process. Electronically signed by: Norman Gatlin MD 10/16/2024 06:51 PM EST RP Workstation: [id number]     Procedures   Medications Ordered in the ED - No data to display                                  Medical Decision Making Amount and/or Complexity of Data Reviewed Radiology: ordered.  Risk OTC drugs. Prescription drug management.   BP (!) 130/98 (BP Location: Right Arm)   Pulse (!) 107   Temp 98.9 F (37.2 C)   Resp 18   Ht 5' 4 (1.626 m)   Wt 74.8 kg   SpO2 100%   BMI 28.32 kg/m   50:19 PM  48 year old female presenting with cold  symptoms.  Patient states for the past 4 days she has had sinus congestion, throat irritation, hoarseness, productive cough, and generalized fatigue.  She does not endorse any fever chills or bodyaches she denies any shortness of breath no nausea vomiting or diarrhea.  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  She tries taking Mucinex and over-the-counter medication without adequate relief.  She denies alcohol or tobacco use.  No prior history of PE or DVT.  On exam patient does exhibit hoarseness.  Throat exam unremarkable.  Lungs are clear.  Abdomen soft nontender.  Nose is unremarkable.  Labs and imaging obtained and independently viewed interpret by me and overall reassuring.  Patient has negative for COVID, flu, RSV.  Chest x-ray without any signs of pneumonia.  Given her presentation, will treat for bronchitis.  After further discussion, will also prescribe Z-Pak for patient to have on hand but encouraged patient to wait for a few more days and stop taking antibiotic if her symptoms does not improve.  Work note provided.     Final diagnoses:  Upper respiratory tract infection, unspecified type  Laryngitis    ED Discharge Orders          Ordered    predniSONE (DELTASONE) 20 MG tablet        10/16/24 1816    benzonatate  (TESSALON ) 100 MG capsule  Every 8 hours        10/16/24 1816    guaiFENesin 200 MG tablet  Every 4 hours PRN        10/16/24 1816    azithromycin (ZITHROMAX Z-PAK) 250 MG tablet        10/16/24 1900               Nivia Colon, PA-C 10/16/24 1900    Emil Share, DO 10/16/24 1944

## 2024-10-20 DIAGNOSIS — Z01419 Encounter for gynecological examination (general) (routine) without abnormal findings: Secondary | ICD-10-CM | POA: Diagnosis not present
